# Patient Record
Sex: Female | Born: 1935 | Hispanic: No | Smoking: Former smoker
Health system: Southern US, Community
[De-identification: ages and names within clinical notes are randomized; demographics above are authoritative.]

## PROBLEM LIST (undated history)

## (undated) DIAGNOSIS — E7800 Pure hypercholesterolemia, unspecified: Secondary | ICD-10-CM

## (undated) DIAGNOSIS — I10 Essential (primary) hypertension: Secondary | ICD-10-CM

## (undated) HISTORY — PX: APPENDECTOMY: SHX54

---

## 2016-01-08 ENCOUNTER — Encounter (HOSPITAL_BASED_OUTPATIENT_CLINIC_OR_DEPARTMENT_OTHER): Payer: Self-pay | Admitting: *Deleted

## 2016-01-08 ENCOUNTER — Observation Stay (HOSPITAL_BASED_OUTPATIENT_CLINIC_OR_DEPARTMENT_OTHER)
Admission: EM | Admit: 2016-01-08 | Discharge: 2016-01-09 | Disposition: A | Discharge status: Home / Self Care | Payer: Self-pay | Attending: Family Medicine | Admitting: Family Medicine

## 2016-01-08 ENCOUNTER — Emergency Department (HOSPITAL_BASED_OUTPATIENT_CLINIC_OR_DEPARTMENT_OTHER): Payer: Self-pay

## 2016-01-08 DIAGNOSIS — R5383 Other fatigue: Secondary | ICD-10-CM

## 2016-01-08 DIAGNOSIS — D649 Anemia, unspecified: Secondary | ICD-10-CM | POA: Diagnosis present

## 2016-01-08 DIAGNOSIS — I10 Essential (primary) hypertension: Secondary | ICD-10-CM | POA: Diagnosis present

## 2016-01-08 DIAGNOSIS — R531 Weakness: Secondary | ICD-10-CM

## 2016-01-08 DIAGNOSIS — Z79899 Other long term (current) drug therapy: Secondary | ICD-10-CM | POA: Insufficient documentation

## 2016-01-08 DIAGNOSIS — Z87891 Personal history of nicotine dependence: Secondary | ICD-10-CM | POA: Insufficient documentation

## 2016-01-08 DIAGNOSIS — D509 Iron deficiency anemia, unspecified: Principal | ICD-10-CM | POA: Insufficient documentation

## 2016-01-08 DIAGNOSIS — E785 Hyperlipidemia, unspecified: Secondary | ICD-10-CM | POA: Diagnosis present

## 2016-01-08 HISTORY — DX: Essential (primary) hypertension: I10

## 2016-01-08 HISTORY — DX: Pure hypercholesterolemia, unspecified: E78.00

## 2016-01-08 LAB — URINALYSIS, ROUTINE W REFLEX MICROSCOPIC
Bilirubin Urine: NEGATIVE
GLUCOSE, UA: NEGATIVE mg/dL
HGB URINE DIPSTICK: NEGATIVE
Ketones, ur: NEGATIVE mg/dL
Nitrite: NEGATIVE
Protein, ur: NEGATIVE mg/dL
SPECIFIC GRAVITY, URINE: 1.008 (ref 1.005–1.030)
pH: 5 (ref 5.0–8.0)

## 2016-01-08 LAB — CBC WITH DIFFERENTIAL/PLATELET
Basophils Absolute: 0.1 10*3/uL (ref 0.0–0.1)
Basophils Relative: 1 %
Eosinophils Absolute: 0.1 10*3/uL (ref 0.0–0.7)
Eosinophils Relative: 1 %
HCT: 18.7 % — ABNORMAL LOW (ref 36.0–46.0)
Hemoglobin: 5.1 g/dL — CL (ref 12.0–15.0)
Lymphocytes Relative: 23 %
Lymphs Abs: 2 10*3/uL (ref 0.7–4.0)
MCH: 17.2 pg — ABNORMAL LOW (ref 26.0–34.0)
MCHC: 27.3 g/dL — ABNORMAL LOW (ref 30.0–36.0)
MCV: 63.2 fL — ABNORMAL LOW (ref 78.0–100.0)
Monocytes Absolute: 0.7 10*3/uL (ref 0.1–1.0)
Monocytes Relative: 8 %
Neutro Abs: 5.6 10*3/uL (ref 1.7–7.7)
Neutrophils Relative %: 67 %
Platelets: 586 10*3/uL — ABNORMAL HIGH (ref 150–400)
RBC: 2.96 MIL/uL — ABNORMAL LOW (ref 3.87–5.11)
RDW: 20.9 % — ABNORMAL HIGH (ref 11.5–15.5)
WBC: 8.5 10*3/uL (ref 4.0–10.5)

## 2016-01-08 LAB — RETICULOCYTES
RBC.: 2.64 MIL/uL — ABNORMAL LOW (ref 3.87–5.11)
RETIC CT PCT: 2.8 % (ref 0.4–3.1)
Retic Count, Absolute: 73.9 10*3/uL (ref 19.0–186.0)

## 2016-01-08 LAB — BASIC METABOLIC PANEL
Anion gap: 8 (ref 5–15)
BUN: 17 mg/dL (ref 6–20)
CO2: 25 mmol/L (ref 22–32)
Calcium: 8.9 mg/dL (ref 8.9–10.3)
Chloride: 101 mmol/L (ref 101–111)
Creatinine, Ser: 1.16 mg/dL — ABNORMAL HIGH (ref 0.44–1.00)
GFR calc Af Amer: 50 mL/min — ABNORMAL LOW (ref >60)
GFR calc non Af Amer: 43 mL/min — ABNORMAL LOW (ref >60)
Glucose, Bld: 134 mg/dL — ABNORMAL HIGH (ref 65–99)
Potassium: 3.9 mmol/L (ref 3.5–5.1)
Sodium: 134 mmol/L — ABNORMAL LOW (ref 135–145)

## 2016-01-08 LAB — IRON AND TIBC
IRON: 12 ug/dL — AB (ref 28–170)
SATURATION RATIOS: 3 % — AB (ref 10.4–31.8)
TIBC: 477 ug/dL — AB (ref 250–450)
UIBC: 465 ug/dL

## 2016-01-08 LAB — URINALYSIS, MICROSCOPIC (REFLEX): RBC / HPF: NONE SEEN RBC/hpf (ref 0–5)

## 2016-01-08 LAB — HEPATIC FUNCTION PANEL
ALBUMIN: 3.2 g/dL — AB (ref 3.5–5.0)
ALK PHOS: 70 U/L (ref 38–126)
ALT: 10 U/L — ABNORMAL LOW (ref 14–54)
AST: 19 U/L (ref 15–41)
BILIRUBIN TOTAL: 0.3 mg/dL (ref 0.3–1.2)
Bilirubin, Direct: <0.1 mg/dL — ABNORMAL LOW (ref 0.1–0.5)
Total Protein: 6.7 g/dL (ref 6.5–8.1)

## 2016-01-08 LAB — VITAMIN B12: VITAMIN B 12: 510 pg/mL (ref 180–914)

## 2016-01-08 LAB — TROPONIN I: Troponin I: <0.03 ng/mL (ref <0.03)

## 2016-01-08 LAB — OCCULT BLOOD X 1 CARD TO LAB, STOOL: Fecal Occult Bld: NEGATIVE

## 2016-01-08 LAB — FERRITIN: FERRITIN: 2 ng/mL — AB (ref 11–307)

## 2016-01-08 LAB — ABO/RH: ABO/RH(D): B POS

## 2016-01-08 LAB — FOLATE: Folate: 30.6 ng/mL (ref >5.9)

## 2016-01-08 LAB — PREPARE RBC (CROSSMATCH)

## 2016-01-08 MED ORDER — ONDANSETRON HCL 4 MG PO TABS: 4.0000 mg | ORAL_TABLET | Freq: Four times a day (QID) | ORAL | Status: DC | PRN

## 2016-01-08 MED ORDER — SIMVASTATIN 20 MG PO TABS
20.0000 mg | ORAL_TABLET | Freq: Every day | ORAL | Status: DC
  Administered 2016-01-08: 20 mg via ORAL
  Filled 2016-01-08: qty 1

## 2016-01-08 MED ORDER — ESCITALOPRAM OXALATE 20 MG PO TABS
20.0000 mg | ORAL_TABLET | Freq: Every day | ORAL | Status: DC
  Administered 2016-01-09: 20 mg via ORAL
  Filled 2016-01-08: qty 1

## 2016-01-08 MED ORDER — SODIUM CHLORIDE 0.9 % IV BOLUS (SEPSIS)
500.0000 mL | Freq: Once | INTRAVENOUS | Status: AC
  Administered 2016-01-08: 500 mL via INTRAVENOUS

## 2016-01-08 MED ORDER — ONDANSETRON HCL 4 MG/2ML IJ SOLN: 4.0000 mg | Freq: Four times a day (QID) | INTRAMUSCULAR | Status: DC | PRN

## 2016-01-08 MED ORDER — ACETAMINOPHEN 650 MG RE SUPP: 650.0000 mg | Freq: Four times a day (QID) | RECTAL | Status: DC | PRN

## 2016-01-08 MED ORDER — METOPROLOL TARTRATE 25 MG PO TABS
25.0000 mg | ORAL_TABLET | Freq: Three times a day (TID) | ORAL | Status: DC
  Administered 2016-01-09 (×2): 25 mg via ORAL
  Filled 2016-01-08 (×2): qty 1

## 2016-01-08 MED ORDER — ACETAMINOPHEN 325 MG PO TABS: 650.0000 mg | ORAL_TABLET | Freq: Four times a day (QID) | ORAL | Status: DC | PRN

## 2016-01-08 MED ORDER — SODIUM CHLORIDE 0.9 % IV SOLN
10.0000 mL/h | Freq: Once | INTRAVENOUS | Status: AC
  Administered 2016-01-08: 10 mL/h via INTRAVENOUS

## 2016-01-08 NOTE — ED Provider Notes (Signed)
[facility] Provider Note   CSN: [id number] Arrival date & time: 01/08/16  1224     History   Chief Complaint Chief Complaint  Patient presents with  . Shortness of Breath    HPI Wendy Jenkins is a 82 y.o. female.  The history is provided by the patient and medical records.  Shortness of Breath    82 year old female with history of hyperlipidemia hypertension, reported history of anemia, presenting to the ED for weakness, fatigue, shortness of breath. Patient only speaks [place], daughters at the bedside translating. Daughter reports patient has been experiencing these symptoms for about 3 weeks now, worse over the past 3 days. Patient was seen by her primary care doctor last week at her daughter's request. She had blood work done which came back revealing a hemoglobin of 5.4. Patient was told to go to the ED 2 days ago, however refused to go. Daughter reports that she has been seemingly getting worse, she did convince her to come to the ED today. She's not had any cough or fever. States she has had loose stools, light brown in color, for the past several weeks. No known sick contacts. Denies any abdominal pain.  Denies blood in the stool or melena.  Daughter reports she was told that she was anemic several years ago and briefly took Fe+ supplements but never took them regularly as she often refuses to take medications.  Daughter has been trying to give her multivitamins at home.  Denies chest pain.  No cardiac hx.  Was a smoker for 30+ years.  Past Medical History:  Diagnosis Date  . High cholesterol   . Hypertension     There are no active problems to display for this patient.   Past Surgical History:  Procedure Laterality Date  . APPENDECTOMY      OB History    No data available       Home Medications    Prior to Admission medications   Medication Sig Start Date End Date Taking? Authorizing Provider  Aspirin (ASPIR-81 PO) Take by mouth.   Yes  Historical Provider, MD  Escitalopram Oxalate (LEXAPRO PO) Take by mouth.   Yes Historical Provider, MD  MELATONIN PO Take by mouth.   Yes Historical Provider, MD  metoprolol tartrate (LOPRESSOR) 25 MG tablet Take 25 mg by mouth 2 (two) times daily.   Yes Historical Provider, MD  Multiple Vitamin (MULTI-VITAMIN DAILY PO) Take by mouth.   Yes Historical Provider, MD  SIMVASTATIN PO Take by mouth.   Yes Historical Provider, MD    Family History No family history on file.  Social History Social History  Substance Use Topics  . Smoking status: Former Games developermoker  . Smokeless tobacco: Never Used  . Alcohol use No     Allergies   Patient has no known allergies.   Review of Systems Review of Systems  Respiratory: Positive for shortness of breath.   Gastrointestinal: Positive for diarrhea.  Neurological: Positive for weakness.  All other systems reviewed and are negative.    Physical Exam Updated Vital Signs BP (!) 103/48 (BP Location: Left Arm)   Pulse 85   Temp 98 F (36.7 C) (Oral)   Resp (!) 28   Ht 5' (1.524 m)   Wt 69.9 kg   SpO2 97%   BMI 30.08 kg/m   Physical Exam  Constitutional: She is oriented to person, place, and time. She appears well-developed and well-nourished.  HENT:  Head: Normocephalic and atraumatic.  Mouth/Throat:  Oropharynx is clear and moist.  Eyes: Conjunctivae and EOM are normal. Pupils are equal, round, and reactive to light.  Conjunctiva pale  Neck: Normal range of motion.  Cardiovascular: Normal rate, regular rhythm and normal heart sounds.   Pulmonary/Chest: Effort normal and breath sounds normal. No respiratory distress. She has no wheezes.  No wheezes or rhonchi, no distress, able to speak in full sentences  Abdominal: Soft. Bowel sounds are normal. There is no tenderness. There is no rigidity and no guarding.  Musculoskeletal: Normal range of motion.  Neurological: She is alert and oriented to person, place, and time.  Skin: Skin is warm  and dry.  Psychiatric: She has a normal mood and affect.  Nursing note and vitals reviewed.    ED Treatments / Results  Labs (all labs ordered are listed, but only abnormal results are displayed) Labs Reviewed  CBC WITH DIFFERENTIAL/PLATELET - Abnormal; Notable for the following:       Result Value   RBC 2.96 (*)    Hemoglobin 5.1 (*)    HCT 18.7 (*)    MCV 63.2 (*)    MCH 17.2 (*)    MCHC 27.3 (*)    RDW 20.9 (*)    Platelets 586 (*)    All other components within normal limits  BASIC METABOLIC PANEL - Abnormal; Notable for the following:    Sodium 134 (*)    Glucose, Bld 134 (*)    Creatinine, Ser 1.16 (*)    GFR calc non Af Amer 43 (*)    GFR calc Af Amer 50 (*)    All other components within normal limits  HEPATIC FUNCTION PANEL - Abnormal; Notable for the following:    Albumin 3.2 (*)    ALT 10 (*)    Bilirubin, Direct <0.1 (*)    All other components within normal limits  URINALYSIS, ROUTINE W REFLEX MICROSCOPIC - Abnormal; Notable for the following:    Leukocytes, UA TRACE (*)    All other components within normal limits  URINALYSIS, MICROSCOPIC (REFLEX) - Abnormal; Notable for the following:    Bacteria, UA FEW (*)    Squamous Epithelial / LPF 0-5 (*)    All other components within normal limits  TROPONIN I  OCCULT BLOOD X 1 CARD TO LAB, STOOL  VITAMIN B12  FOLATE  IRON AND TIBC  FERRITIN  RETICULOCYTES    EKG  EKG Interpretation None       Radiology Dg Chest 2 View  Result Date: 01/08/2016 CLINICAL DATA:  Dyspnea and wheezing for 3 weeks.  Nonsmoker. EXAM: CHEST  2 VIEW COMPARISON:  None. FINDINGS: Mild enlargement of the cardiopericardial silhouette. Normal mediastinal contour. No pneumothorax. No pleural effusion. No overt pulmonary edema. No acute consolidative airspace disease. IMPRESSION: Mild enlargement of the cardiopericardial silhouette. Pericardial effusion cannot be excluded. No overt pulmonary edema. No acute pulmonary disease.  Electronically Signed   By: Delbert PhenixJason A Poff M.D.   On: 01/08/2016 13:27    Procedures Procedures (including critical care time)  Medications Ordered in ED Medications - No data to display   Initial Impression / Assessment and Plan / ED Course  I have reviewed the triage vital signs and the nursing notes.  Pertinent labs & imaging results that were available during my care of the patient were reviewed by me and considered in my medical decision making (see chart for details).  Clinical Course    82 year old female here with 3 weeks of fatigue, generalized weakness, and shortness of  breath and was told of hemoglobin of 5.1, however refused hospital transport at that time. Here today with worsening symptoms.  Patient is afebrile, non-toxic in appearance.  NAD.  VSS on RA.  No respiratory distress noted.  Work-up today with hgb of 5.1, appears microcytic.  Hemoccult negative.  Anemia panel sent.  CXR clear, trop negative.  VS remain stable on RA.  Patient will require admission for transfusion.  Discussed with patient and family members, they acknowledged understanding and need for hospitalization.  Will arrange transport to [facility] for admission.  hospitalist paged.  Discussed with hospitalist, Dr. Konrad Dolores-- will admit to tele/obs.  Have placed orders for transfusion of 2 units upon arrival there.  Temp admit orders placed.  Patient remains stable for transport.  Final Clinical Impressions(s) / ED Diagnoses   Final diagnoses:  Symptomatic anemia  Fatigue, unspecified type  Generalized weakness    New Prescriptions New Prescriptions   No medications on file     Garlon Hatchet, Cordelia Poche 01/08/16 1534    Raeford Razor, MD 01/09/16 1204

## 2016-01-08 NOTE — ED Triage Notes (Signed)
SOB x 3 weeks. She was seen by her MD last week and has test. She was called yesterday and told her HGB was 5.4 she was told to go to the ED but pt refused to go. She agreed to come to the ED today.

## 2016-01-08 NOTE — H&P (Signed)
History and Physical    Wendy Jenkins KGM:[id number] DOB: 24-Nov-1932 DOA: 01/08/2016  PCP: No PCP Per Patient  Patient coming from: Home.  [place] Nurse, learning disability used. History also obtained from patient's daughter.  Chief Complaint: Low hemoglobin.  HPI: Wendy Jenkins is a 82 y.o. female with history of hypertension, hyperlipidemia and possible CHF who has recently moved from [place] last year and lives with her daughter has been experiencing exertional shortness of breath over the last 3 weeks. Denies any chest pain. Patient had followed up with her PCP yesterday and had blood works done which showed hemoglobin of 5 and was referred to the ER. Patient has not noticed any black stools. Patient does take aspirin. Denies taking any NSAIDs. Stool for occult blood was negative. Due to symptomatic anemia patient is being admitted for blood transfusion.   ED Course: Hemoglobin was found to be around 5. Ferritin of 5. Chest x-ray shows cardiomegaly. EKG shows normal sinus rhythm. Stool for occult blood was negative.  Review of Systems: As per HPI, rest all negative.   Past Medical History:  Diagnosis Date  . High cholesterol   . Hypertension     Past Surgical History:  Procedure Laterality Date  . APPENDECTOMY       reports that she has quit smoking. She has never used smokeless tobacco. She reports that she does not drink alcohol. Her drug history is not on file.  No Known Allergies  Family History  Problem Relation Age of Onset  . Leukemia Mother   . Colon cancer Sister     Prior to Admission medications   Medication Sig Start Date End Date Taking? Authorizing Provider  Aspirin (ASPIR-81 PO) Take 1 tablet by mouth daily.    Yes Historical Provider, MD  escitalopram (LEXAPRO) 20 MG tablet Take 20 mg by mouth daily.   Yes Historical Provider, MD  furosemide (LASIX) 40 MG tablet Take 40 mg by mouth.   Yes Historical Provider, MD  MELATONIN PO Take 1 tablet by mouth at bedtime.  10mg    Yes Historical Provider, MD  metoprolol tartrate (LOPRESSOR) 25 MG tablet Take 25 mg by mouth 3 (three) times daily. Family members did state she is taking three times a day   Yes Historical Provider, MD  Multiple Vitamin (MULTI-VITAMIN DAILY PO) Take 1 tablet by mouth daily.    Yes Historical Provider, MD  simvastatin (ZOCOR) 20 MG tablet Take 20 mg by mouth at bedtime.   Yes Historical Provider, MD    Physical Exam: Vitals:   01/08/16 1335 01/08/16 1500 01/08/16 1600 01/08/16 1700  BP: (!) 94/45 106/88 112/90 115/56  Pulse: 70  (!) 54 77  Resp: 19 20 19  (!) 30  Temp:      TempSrc:      SpO2: 99%  98% 100%  Weight:      Height:          Constitutional: Moderately built and nourished. Vitals:   01/08/16 1335 01/08/16 1500 01/08/16 1600 01/08/16 1700  BP: (!) 94/45 106/88 112/90 115/56  Pulse: 70  (!) 54 77  Resp: 19 20 19  (!) 30  Temp:      TempSrc:      SpO2: 99%  98% 100%  Weight:      Height:       Eyes: Anicteric mild pallor. ENMT: No discharge from the ears eyes nose and mouth. Neck: No mass felt. No neck rigidity. Respiratory: No rhonchi or crepitations. Cardiovascular: S1 and S2 heard no murmurs  appreciated. Abdomen: Soft nontender bowel sounds present. No guarding or rigidity. Musculoskeletal: No edema. Skin: No rash. Neurologic: Alert awake oriented to time place and person. Moves all extremities. Psychiatric: Appears normal. Normal affect.   Labs on Admission: I have personally reviewed following labs and imaging studies  CBC:  Recent Labs Lab 01/08/16 1250  WBC 8.5  NEUTROABS 5.6  HGB 5.1*  HCT 18.7*  MCV 63.2*  PLT 586*   Basic Metabolic Panel:  Recent Labs Lab 01/08/16 1250  NA 134*  K 3.9  CL 101  CO2 25  GLUCOSE 134*  BUN 17  CREATININE 1.16*  CALCIUM 8.9   GFR: Estimated Creatinine Clearance: 33.2 mL/min (by C-G formula based on SCr of 1.16 mg/dL (H)). Liver Function Tests:  Recent Labs Lab 01/08/16 1320  AST 19    ALT 10*  ALKPHOS 70  BILITOT 0.3  PROT 6.7  ALBUMIN 3.2*   No results for input(s): LIPASE, AMYLASE in the last 168 hours. No results for input(s): AMMONIA in the last 168 hours. Coagulation Profile: No results for input(s): INR, PROTIME in the last 168 hours. Cardiac Enzymes:  Recent Labs Lab 01/08/16 1320  TROPONINI <0.03   BNP (last 3 results) No results for input(s): PROBNP in the last 8760 hours. HbA1C: No results for input(s): HGBA1C in the last 72 hours. CBG: No results for input(s): GLUCAP in the last 168 hours. Lipid Profile: No results for input(s): CHOL, HDL, LDLCALC, TRIG, CHOLHDL, LDLDIRECT in the last 72 hours. Thyroid Function Tests: No results for input(s): TSH, T4TOTAL, FREET4, T3FREE, THYROIDAB in the last 72 hours. Anemia Panel:  Recent Labs  01/08/16 1320  VITAMINB12 510  FOLATE 30.6  FERRITIN 2*  TIBC 477*  IRON 12*  RETICCTPCT 2.8   Urine analysis:    Component Value Date/Time   COLORURINE YELLOW 01/08/2016 1320   APPEARANCEUR CLEAR 01/08/2016 1320   LABSPEC 1.008 01/08/2016 1320   PHURINE 5.0 01/08/2016 1320   GLUCOSEU NEGATIVE 01/08/2016 1320   HGBUR NEGATIVE 01/08/2016 1320   BILIRUBINUR NEGATIVE 01/08/2016 1320   KETONESUR NEGATIVE 01/08/2016 1320   PROTEINUR NEGATIVE 01/08/2016 1320   NITRITE NEGATIVE 01/08/2016 1320   LEUKOCYTESUR TRACE (A) 01/08/2016 1320   Sepsis Labs: @LABRCNTIP (procalcitonin:4,lacticidven:4) )No results found for this or any previous visit (from the past 240 hour(s)).   Radiological Exams on Admission: Dg Chest 2 View  Result Date: 01/08/2016 CLINICAL DATA:  Dyspnea and wheezing for 3 weeks.  Nonsmoker. EXAM: CHEST  2 VIEW COMPARISON:  None. FINDINGS: Mild enlargement of the cardiopericardial silhouette. Normal mediastinal contour. No pneumothorax. No pleural effusion. No overt pulmonary edema. No acute consolidative airspace disease. IMPRESSION: Mild enlargement of the cardiopericardial silhouette.  Pericardial effusion cannot be excluded. No overt pulmonary edema. No acute pulmonary disease. Electronically Signed   By: Delbert PhenixJason A Poff M.D.   On: 01/08/2016 13:27    EKG: Independently reviewed. Normal sinus rhythm.  Assessment/Plan Active Problems:   Symptomatic anemia   HLD (hyperlipidemia)   Essential hypertension   Anemia    1. Symptomatic anemia and iron deficiency anemia - 2 units of packed red blood cell transfusion has been ordered. Patient will need to be discharged on iron tablets. I have discussed about colonoscopy but patient's daughter at this time is not keen on pursuing colonoscopy. 2. Hypertension on metoprolol. 3. Possible CHF with chest x-ray showing cardiomegaly - patient is on Lasix. Please order after transfusion. 4. Hyperlipidemia on statins.   DVT prophylaxis: SCDs. Code Status: DO NOT RESUSCITATE.  Family Communication: Patient's daughter.  Disposition Plan: Home.  Consults called: None.  Admission status: Observation.    Eduard ClosKAKRAKANDY,Carlina Derks N. MD [facility] Pager [phone].  If 7PM-7AM, please contact night-coverage [email or web address] Password [email or web address]  01/08/2016, 9:03 PM

## 2016-01-08 NOTE — ED Notes (Signed)
Family at bedside. 

## 2016-01-09 DIAGNOSIS — E785 Hyperlipidemia, unspecified: Secondary | ICD-10-CM

## 2016-01-09 DIAGNOSIS — I10 Essential (primary) hypertension: Secondary | ICD-10-CM

## 2016-01-09 DIAGNOSIS — D649 Anemia, unspecified: Secondary | ICD-10-CM

## 2016-01-09 LAB — BASIC METABOLIC PANEL
Anion gap: 9 (ref 5–15)
BUN: 13 mg/dL (ref 6–20)
CHLORIDE: 104 mmol/L (ref 101–111)
CO2: 25 mmol/L (ref 22–32)
CREATININE: 1.02 mg/dL — AB (ref 0.44–1.00)
Calcium: 9 mg/dL (ref 8.9–10.3)
GFR calc Af Amer: 58 mL/min — ABNORMAL LOW (ref >60)
GFR calc non Af Amer: 50 mL/min — ABNORMAL LOW (ref >60)
Glucose, Bld: 144 mg/dL — ABNORMAL HIGH (ref 65–99)
Potassium: 3.6 mmol/L (ref 3.5–5.1)
Sodium: 138 mmol/L (ref 135–145)

## 2016-01-09 LAB — CBC
HEMATOCRIT: 27 % — AB (ref 36.0–46.0)
Hemoglobin: 8.2 g/dL — ABNORMAL LOW (ref 12.0–15.0)
MCH: 20.6 pg — AB (ref 26.0–34.0)
MCHC: 30.4 g/dL (ref 30.0–36.0)
MCV: 67.7 fL — AB (ref 78.0–100.0)
PLATELETS: 459 10*3/uL — AB (ref 150–400)
RBC: 3.99 MIL/uL (ref 3.87–5.11)
RDW: 22.9 % — AB (ref 11.5–15.5)
WBC: 5.5 10*3/uL (ref 4.0–10.5)

## 2016-01-09 MED ORDER — SODIUM CHLORIDE 0.9 % IV SOLN
510.0000 mg | Freq: Once | INTRAVENOUS | Status: DC
  Filled 2016-01-09: qty 17

## 2016-01-09 MED ORDER — SODIUM CHLORIDE 0.9 % IV SOLN
510.0000 mg | Freq: Once | INTRAVENOUS | Status: AC
  Administered 2016-01-09: 510 mg via INTRAVENOUS
  Filled 2016-01-09 (×3): qty 17

## 2016-01-09 MED ORDER — FERROUS SULFATE 325 (65 FE) MG PO TABS: 325.0000 mg | ORAL_TABLET | Freq: Two times a day (BID) | ORAL | 0 refills | Status: DC

## 2016-01-09 NOTE — Progress Notes (Signed)
Called [employer]pre[employer] several times today for [place] translation with regards to her medications,side effects  and the scheduled dose of intravenous fereheme.Explained to patient that the plan for her is to be discharged today and she needs somebody from her family members to get her from here this afternoon.Patient given a chance to ask questions and were answered satisfactorily by this Clinical research associatewriter

## 2016-01-09 NOTE — Progress Notes (Signed)
MEDICATION RELATED CONSULT NOTE - INITIAL   Pharmacy Consult for IV Iron Indication: Symptomatic Iron Deficiency Anemia  No Known Allergies  Patient Measurements: Height: 5' (152.4 cm) Weight: 146 lb 9.7 oz (66.5 kg) IBW/kg (Calculated) : 45.5  Vital Signs: Temp: 98.2 F (36.8 C) (12/29 0821) Temp Source: Oral (12/29 0821) BP: 121/59 (12/29 0821) Pulse Rate: 76 (12/29 0400) Intake/Output from previous day: 12/28 0701 - 12/29 0700 In: 1030 [P.O.:360; Blood:670] Out: 0  Intake/Output from this shift: No intake/output data recorded.  Labs:  Recent Labs  01/08/16 1250 01/08/16 1320  WBC 8.5  --   HGB 5.1*  --   HCT 18.7*  --   PLT 586*  --   CREATININE 1.16*  --   ALBUMIN  --  3.2*  PROT  --  6.7  AST  --  19  ALT  --  10*  ALKPHOS  --  70  BILITOT  --  0.3  BILIDIR  --  <0.1*  IBILI  --  NOT CALCULATED   Estimated Creatinine Clearance: 32.4 mL/min (by C-G formula based on SCr of 1.16 mg/dL (H)).   Microbiology: No results found for this or any previous visit (from the past 720 hour(s)).  Medical History: Past Medical History:  Diagnosis Date  . High cholesterol   . Hypertension     Assessment: Wendy Jenkins is an 82 yo F who presented from PCP after blood work showing Hemoglobin of 5. In the ED, fecal occult blood test was negative and patient denied dark stools. Hemoglobin checked here is consistent with outpatient report. CBC consistent with microcytic anemia. Iron studies revealed ferritin of 2 and a TSAT of 3%. Pharmacy consulted to dose IV iron.  Goal of Therapy:  Hemoglobin 12- 15 g/dL Ferritin 161500 - 096800 ng/mL TSAT 30 - 50%  Plan:  Feraheme 510mg  x1; may repeat in 3 days if desired Monitor hemoglobin and iron studies Pharmacy will monitor peripherally  Alfredo BachJoseph Arminger, Cleotis NipperBS, PharmD Clinical Pharmacy Resident [phone] (Pager) 01/09/2016 9:11 AM   I discussed / reviewed the pharmacy note by Dr. Lavella LemonsArminger and I agree with the resident's findings and  plans as documented.  Toys 'R' UsKimberly Aleric Froelich, Pharm.D., BCPS Clinical Pharmacist Pager [phone] 01/09/2016 11:35 AM

## 2016-01-09 NOTE — Discharge Summary (Signed)
PATIENT DETAILS Name: Wendy Jenkins Age: 82 y.o. Sex: female Date of Birth: 11-06-1932 MRN: [id number]. Admitting Physician: Ozella Rocks, MD [id number], Katharine Look, MD  Admit Date: 01/08/2016 Discharge date: 01/09/2016  Recommendations for Outpatient Follow-up:  1. Follow up with PCP in 1-2 weeks 2. Started on iron supplementation 3. Please obtain BMP/CBC in one week 4. If patient changes her mind about endoscopic procedures-please refer to GI for colonoscopy.  Admitted From:  Home   Disposition: Home    Home Health: No  Equipment/Devices: None  Discharge Condition: Stable  CODE STATUS: FULL CODE  Diet recommendation:  Heart Healthy   Brief Summary: See H&P, Labs, Consult and Test reports for all details in brief, Wendy Jenkins is a 82 y.o. female with history of hypertension, hyperlipidemia who has recently moved from [place] last year and lives with her daughter was experiencing exertional shortness of breath over the last 3 weeks-him to have a hemoglobin of 5.1. She was subsequently admitted for further evaluation and treatment.  Brief Hospital Course: Microcytic anemia: No overt history of black stools/GI bleed. FOBT negative. She was admitted and transfused 2 units. Iron panel consistent with iron deficiency-and started on iron supplementation. Long discussion with the patient with translator over the phone, and subsequently with her daughter who is a nurse over the phone-patient refuses to consider any endoscopic procedures at this time. I have explained that she could have underlying malignancy-colon cancer. I have asked the patient and the daughter to follow up with their PCP for CBC checks, and if patient changes her mind regarding endoscopic procedures-to follow up with the PCP and get a referral to GI.  Note-patient is repeatedly requesting to be discharged and does not want to be hospitalized any further.  Rest of her medical problems were stable during  this short hospital stay.  Procedures/Studies: None  Discharge Diagnoses:  Active Problems:   Symptomatic anemia   HLD (hyperlipidemia)   Essential hypertension   Anemia   Discharge Instructions:  Activity:  As tolerated with Full fall precautions use walker/cane & assistance as needed  Discharge Instructions    Diet - low sodium heart healthy    Complete by:  As directed    Discharge instructions    Complete by:  As directed    Follow with Primary MD in 1 week  Please get a complete blood count and chemistry panel checked by your Primary MD at your next visit, and again as instructed by your Primary MD.  Get Medicines reviewed and adjusted: Please take all your medications with you for your next visit with your Primary MD  Laboratory/radiological data: Please request your Primary MD to go over all hospital tests and procedure/radiological results at the follow up, please ask your Primary MD to get all Hospital records sent to his/her office.  In some cases, they will be blood work, cultures and biopsy results pending at the time of your discharge. Please request that your primary care M.D. follows up on these results.  Also Note the following: If you experience worsening of your admission symptoms, develop shortness of breath, life threatening emergency, suicidal or homicidal thoughts you must seek medical attention immediately by calling 911 or calling your MD immediately  if symptoms less severe.  You must read complete instructions/literature along with all the possible adverse reactions/side effects for all the Medicines you take and that have been prescribed to you. Take any new Medicines after you have completely understood and accpet all the possible  adverse reactions/side effects.   Do not drive when taking Pain medications or sleeping medications (Benzodaizepines)  Do not take more than prescribed Pain, Sleep and Anxiety Medications. It is not advisable to combine  anxiety,sleep and pain medications without talking with your primary care practitioner  Special Instructions: If you have smoked or chewed Tobacco  in the last 2 yrs please stop smoking, stop any regular Alcohol  and or any Recreational drug use.  Wear Seat belts while driving.  Please note: You were cared for by a hospitalist during your hospital stay. Once you are discharged, your primary care physician will handle any further medical issues. Please note that NO REFILLS for any discharge medications will be authorized once you are discharged, as it is imperative that you return to your primary care physician (or establish a relationship with a primary care physician if you do not have one) for your post hospital discharge needs so that they can reassess your need for medications and monitor your lab values.   Increase activity slowly    Complete by:  As directed      Allergies as of 01/09/2016   No Known Allergies     Medication List    STOP taking these medications   ASPIR-81 PO     TAKE these medications   escitalopram 20 MG tablet Commonly known as:  LEXAPRO Take 20 mg by mouth daily.   ferrous sulfate 325 (65 FE) MG tablet Take 1 tablet (325 mg total) by mouth 2 (two) times daily with a meal.   furosemide 40 MG tablet Commonly known as:  LASIX Take 40 mg by mouth.   MELATONIN PO Take 1 tablet by mouth at bedtime. 10mg    metoprolol tartrate 25 MG tablet Commonly known as:  LOPRESSOR Take 25 mg by mouth 3 (three) times daily. Family members did state she is taking three times a day   MULTI-VITAMIN DAILY PO Take 1 tablet by mouth daily.   simvastatin 20 MG tablet Commonly known as:  ZOCOR Take 20 mg by mouth at bedtime.      Follow-up Information    Primary MD. Schedule an appointment as soon as possible for a visit in 1 week(s).          No Known Allergies  Consultations:   None  Other Procedures/Studies: Dg Chest 2 View  Result Date:  01/08/2016 CLINICAL DATA:  Dyspnea and wheezing for 3 weeks.  Nonsmoker. EXAM: CHEST  2 VIEW COMPARISON:  None. FINDINGS: Mild enlargement of the cardiopericardial silhouette. Normal mediastinal contour. No pneumothorax. No pleural effusion. No overt pulmonary edema. No acute consolidative airspace disease. IMPRESSION: Mild enlargement of the cardiopericardial silhouette. Pericardial effusion cannot be excluded. No overt pulmonary edema. No acute pulmonary disease. Electronically Signed   By: Delbert Phenix M.D.   On: 01/08/2016 13:27     TODAY-DAY OF DISCHARGE:  Subjective:   Neena Rhymes today has no headache,no chest abdominal pain,no new weakness tingling or numbness, feels much better wants to go home today.   Objective:   Blood pressure (!) 119/42, pulse (!) 54, temperature 98.7 F (37.1 C), temperature source Oral, resp. rate 16, height 5' (1.524 m), weight 66.5 kg (146 lb 9.7 oz), SpO2 99 %.  Intake/Output Summary (Last 24 hours) at 01/09/16 1204 Last data filed at 01/09/16 1127  Gross per 24 hour  Intake             1550 ml  Output  0 ml  Net             1550 ml   Filed Weights   01/08/16 1230 01/08/16 2130 01/09/16 0500  Weight: 69.9 kg (154 lb) 69.8 kg (153 lb 14.1 oz) 66.5 kg (146 lb 9.7 oz)    Exam: Awake Alert, Oriented *3, No new F.N deficits, Normal affect [place].AT,PERRAL Supple Neck,No JVD, No cervical lymphadenopathy appriciated.  Symmetrical Chest wall movement, Good air movement bilaterally, CTAB RRR,No Gallops,Rubs or new Murmurs, No Parasternal Heave +ve B.Sounds, Abd Soft, Non tender, No organomegaly appriciated, No rebound -guarding or rigidity. No Cyanosis, Clubbing or edema, No new Rash or bruise   PERTINENT RADIOLOGIC STUDIES: Dg Chest 2 View  Result Date: 01/08/2016 CLINICAL DATA:  Dyspnea and wheezing for 3 weeks.  Nonsmoker. EXAM: CHEST  2 VIEW COMPARISON:  None. FINDINGS: Mild enlargement of the cardiopericardial silhouette. Normal  mediastinal contour. No pneumothorax. No pleural effusion. No overt pulmonary edema. No acute consolidative airspace disease. IMPRESSION: Mild enlargement of the cardiopericardial silhouette. Pericardial effusion cannot be excluded. No overt pulmonary edema. No acute pulmonary disease. Electronically Signed   By: Delbert PhenixJason A Poff M.D.   On: 01/08/2016 13:27     PERTINENT LAB RESULTS: CBC:  Recent Labs  01/08/16 1250 01/09/16 0915  WBC 8.5 5.5  HGB 5.1* 8.2*  HCT 18.7* 27.0*  PLT 586* 459*   CMET CMP     Component Value Date/Time   NA 138 01/09/2016 0915   K 3.6 01/09/2016 0915   CL 104 01/09/2016 0915   CO2 25 01/09/2016 0915   GLUCOSE 144 (H) 01/09/2016 0915   BUN 13 01/09/2016 0915   CREATININE 1.02 (H) 01/09/2016 0915   CALCIUM 9.0 01/09/2016 0915   PROT 6.7 01/08/2016 1320   ALBUMIN 3.2 (L) 01/08/2016 1320   AST 19 01/08/2016 1320   ALT 10 (L) 01/08/2016 1320   ALKPHOS 70 01/08/2016 1320   BILITOT 0.3 01/08/2016 1320   GFRNONAA 50 (L) 01/09/2016 0915   GFRAA 58 (L) 01/09/2016 0915    GFR Estimated Creatinine Clearance: 36.8 mL/min (by C-G formula based on SCr of 1.02 mg/dL (H)). No results for input(s): LIPASE, AMYLASE in the last 72 hours.  Recent Labs  01/08/16 1320  TROPONINI <0.03   Invalid input(s): POCBNP No results for input(s): DDIMER in the last 72 hours. No results for input(s): HGBA1C in the last 72 hours. No results for input(s): CHOL, HDL, LDLCALC, TRIG, CHOLHDL, LDLDIRECT in the last 72 hours. No results for input(s): TSH, T4TOTAL, T3FREE, THYROIDAB in the last 72 hours.  Invalid input(s): FREET3  Recent Labs  01/08/16 1320  VITAMINB12 510  FOLATE 30.6  FERRITIN 2*  TIBC 477*  IRON 12*  RETICCTPCT 2.8   Coags: No results for input(s): INR in the last 72 hours.  Invalid input(s): PT Microbiology: No results found for this or any previous visit (from the past 240 hour(s)).  FURTHER DISCHARGE INSTRUCTIONS:  Get Medicines reviewed  and adjusted: Please take all your medications with you for your next visit with your Primary MD  Laboratory/radiological data: Please request your Primary MD to go over all hospital tests and procedure/radiological results at the follow up, please ask your Primary MD to get all Hospital records sent to his/her office.  In some cases, they will be blood work, cultures and biopsy results pending at the time of your discharge. Please request that your primary care M.D. goes through all the records of your hospital data and follows up  on these results.  Also Note the following: If you experience worsening of your admission symptoms, develop shortness of breath, life threatening emergency, suicidal or homicidal thoughts you must seek medical attention immediately by calling 911 or calling your MD immediately  if symptoms less severe.  You must read complete instructions/literature along with all the possible adverse reactions/side effects for all the Medicines you take and that have been prescribed to you. Take any new Medicines after you have completely understood and accpet all the possible adverse reactions/side effects.   Do not drive when taking Pain medications or sleeping medications (Benzodaizepines)  Do not take more than prescribed Pain, Sleep and Anxiety Medications. It is not advisable to combine anxiety,sleep and pain medications without talking with your primary care practitioner  Special Instructions: If you have smoked or chewed Tobacco  in the last 2 yrs please stop smoking, stop any regular Alcohol  and or any Recreational drug use.  Wear Seat belts while driving.  Please note: You were cared for by a hospitalist during your hospital stay. Once you are discharged, your primary care physician will handle any further medical issues. Please note that NO REFILLS for any discharge medications will be authorized once you are discharged, as it is imperative that you return to your  primary care physician (or establish a relationship with a primary care physician if you do not have one) for your post hospital discharge needs so that they can reassess your need for medications and monitor your lab values.  Total Time spent coordinating discharge including counseling, education and face to face time equals 35 minutes.  SignedJeoffrey Massed: Samiyyah Moffa 01/09/2016 12:04 PM

## 2016-01-09 NOTE — Progress Notes (Signed)
Wendy Jenkins to be D/C'd Home per MD order.  Discussed prescriptions and follow up appointments with the patient. Prescriptions given to patient, medication list explained in detail. Pt verbalized understanding.  Allergies as of 01/09/2016   No Known Allergies     Medication List    STOP taking these medications   ASPIR-81 PO     TAKE these medications   escitalopram 20 MG tablet Commonly known as:  LEXAPRO Take 20 mg by mouth daily.   ferrous sulfate 325 (65 FE) MG tablet Take 1 tablet (325 mg total) by mouth 2 (two) times daily with a meal.   furosemide 40 MG tablet Commonly known as:  LASIX Take 40 mg by mouth.   MELATONIN PO Take 1 tablet by mouth at bedtime. 10mg    metoprolol tartrate 25 MG tablet Commonly known as:  LOPRESSOR Take 25 mg by mouth 3 (three) times daily. Family members did state she is taking three times a day   MULTI-VITAMIN DAILY PO Take 1 tablet by mouth daily.   simvastatin 20 MG tablet Commonly known as:  ZOCOR Take 20 mg by mouth at bedtime.       Vitals:   01/09/16 0924 01/09/16 1648  BP: (!) 119/42 (!) 129/58  Pulse: (!) 54 83  Resp: 16 17  Temp: 98.7 F (37.1 C) 99.2 F (37.3 C)    Skin clean, dry and intact without evidence of skin break down, no evidence of skin tears noted. IV catheter discontinued intact. Site without signs and symptoms of complications. Dressing and pressure applied. Pt denies pain at this time. No complaints noted.  An After Visit Summary was printed and given to the patient. Patient escorted via WC, and D/C home via private auto.  Wendy ForehandLuke Lona Jenkins BSN, RN

## 2016-01-10 LAB — TYPE AND SCREEN
ABO/RH(D): B POS
Antibody Screen: NEGATIVE
UNIT DIVISION: 0
UNIT DIVISION: 0

## 2017-10-11 IMAGING — DX DG CHEST 2V
2 series · 2 of 2 positions shown · non-contrast
Comparison: None.

CLINICAL DATA: Dyspnea and wheezing for 3 weeks.  Nonsmoker.

EXAM:
CHEST  2 VIEW

[chest pa]
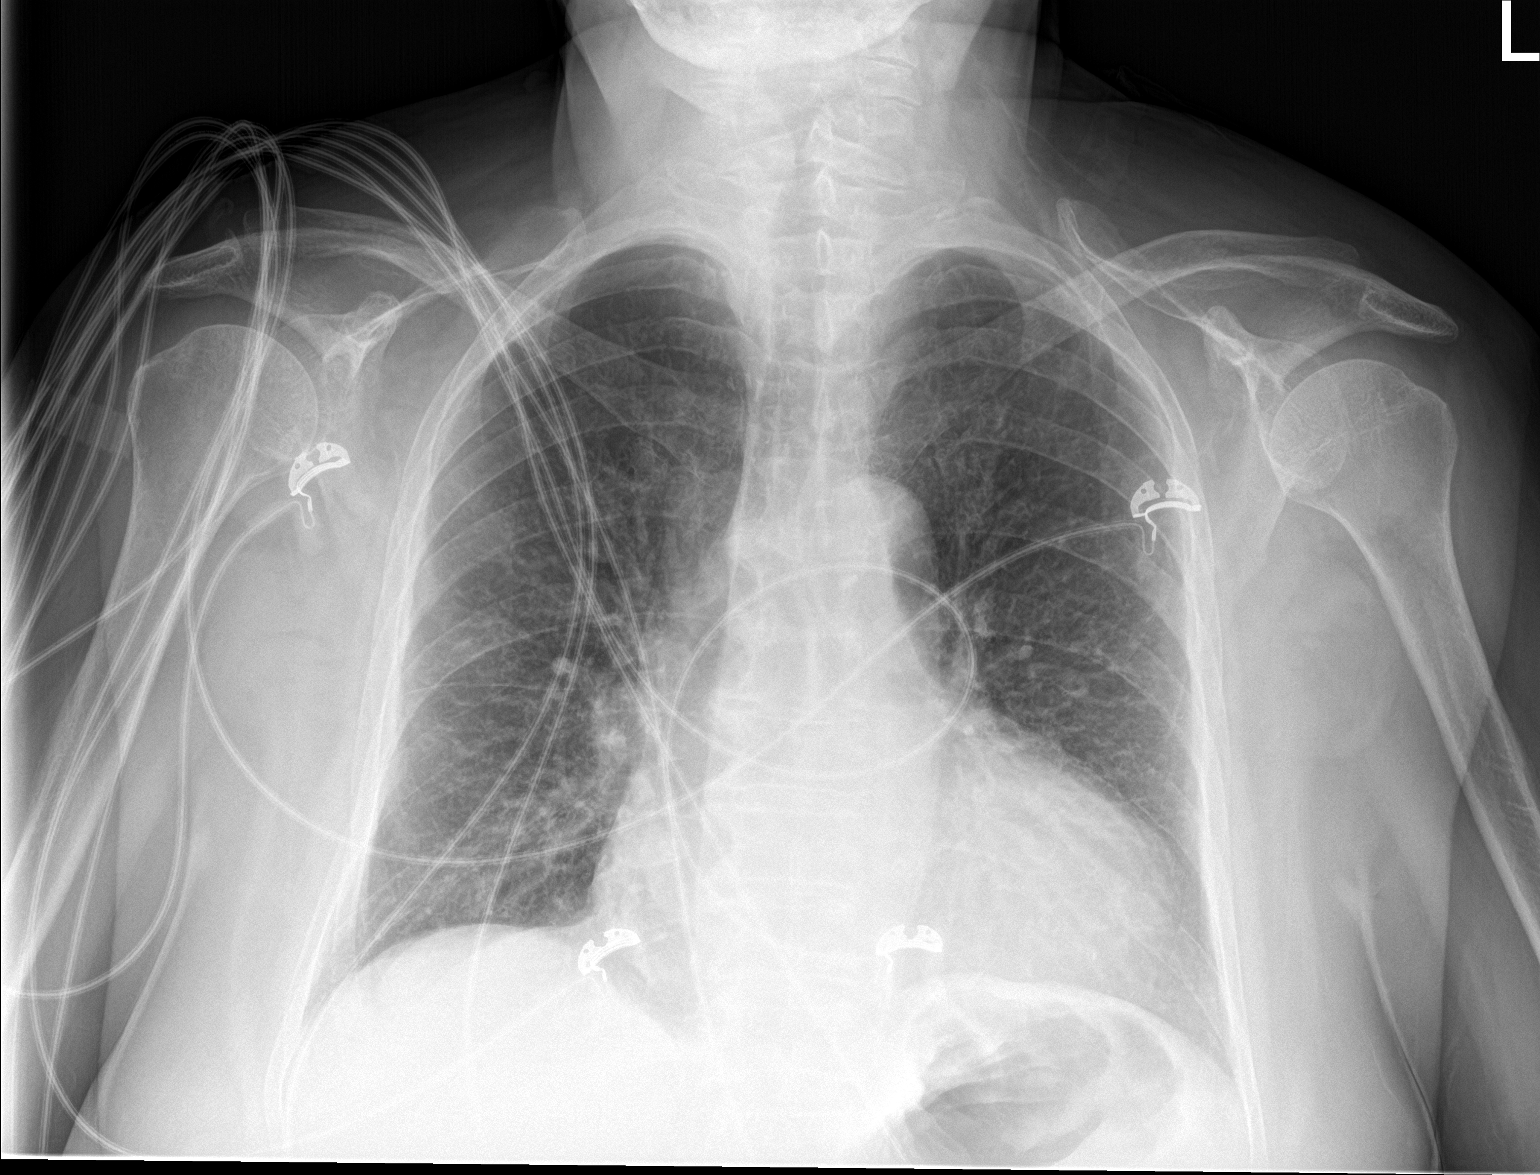

[chest lat]
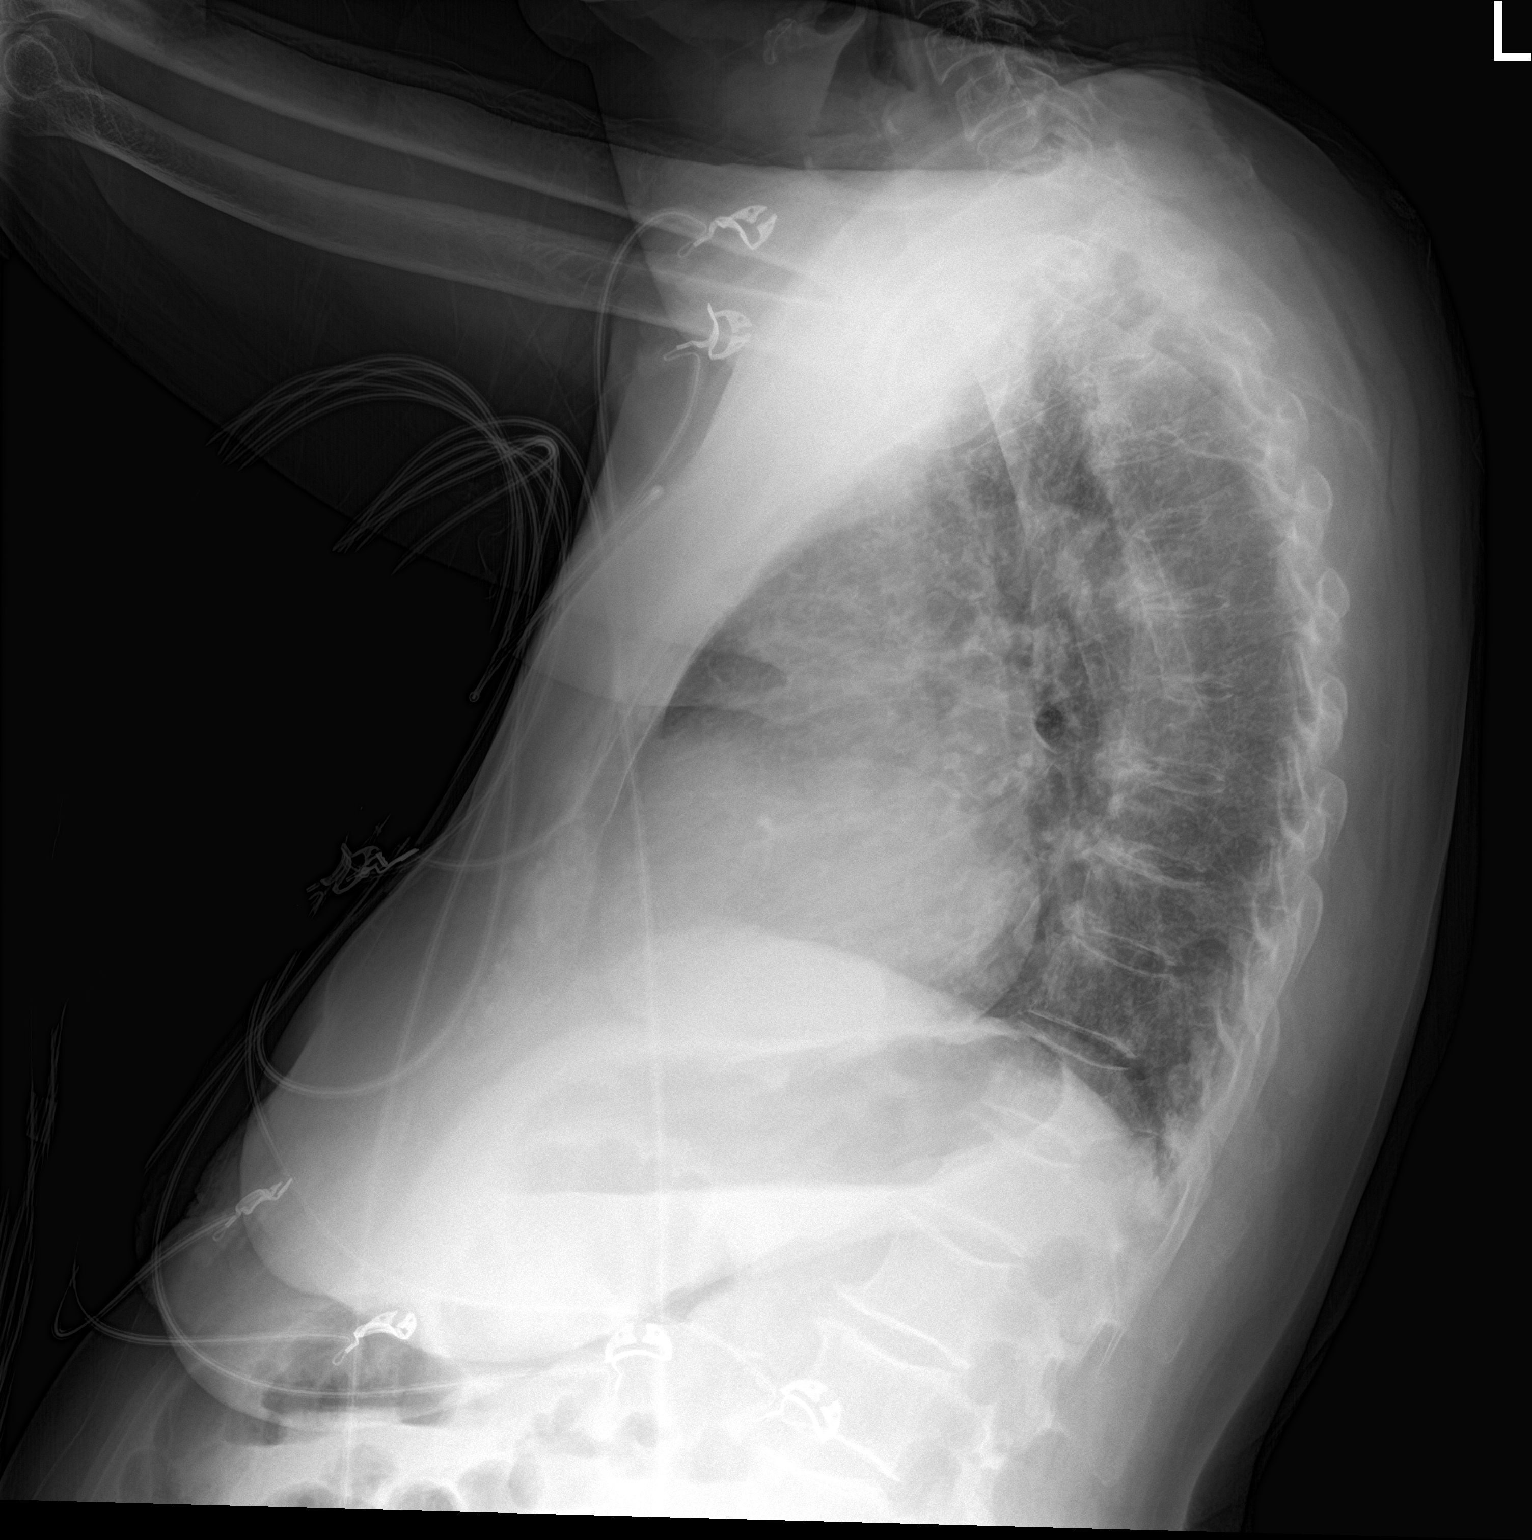

[2 of 2 positions shown; findings below may reference images not displayed]

FINDINGS: Mild enlargement of the cardiopericardial silhouette. Normal
mediastinal contour. No pneumothorax. No pleural effusion. No overt
pulmonary edema. No acute consolidative airspace disease.
IMPRESSION: Mild enlargement of the cardiopericardial silhouette. Pericardial
effusion cannot be excluded. No overt pulmonary edema. No acute
pulmonary disease.

## 2022-05-08 ENCOUNTER — Other Ambulatory Visit: Payer: Self-pay

## 2022-05-08 ENCOUNTER — Emergency Department (HOSPITAL_COMMUNITY): Payer: Medicaid Other

## 2022-05-08 ENCOUNTER — Encounter (HOSPITAL_COMMUNITY): Payer: Self-pay

## 2022-05-08 ENCOUNTER — Emergency Department (HOSPITAL_COMMUNITY)
Admission: EM | Admit: 2022-05-08 | Discharge: 2022-05-09 | Disposition: A | Discharge status: Other Acute Inpatient Hospital | Payer: Medicaid Other | Attending: Emergency Medicine | Admitting: Emergency Medicine

## 2022-05-08 DIAGNOSIS — W1830XA Fall on same level, unspecified, initial encounter: Secondary | ICD-10-CM | POA: Diagnosis not present

## 2022-05-08 DIAGNOSIS — K047 Periapical abscess without sinus: Secondary | ICD-10-CM

## 2022-05-08 DIAGNOSIS — I4891 Unspecified atrial fibrillation: Secondary | ICD-10-CM | POA: Diagnosis not present

## 2022-05-08 DIAGNOSIS — D649 Anemia, unspecified: Secondary | ICD-10-CM | POA: Diagnosis present

## 2022-05-08 DIAGNOSIS — D72829 Elevated white blood cell count, unspecified: Secondary | ICD-10-CM | POA: Insufficient documentation

## 2022-05-08 DIAGNOSIS — L03211 Cellulitis of face: Secondary | ICD-10-CM

## 2022-05-08 DIAGNOSIS — I480 Paroxysmal atrial fibrillation: Secondary | ICD-10-CM | POA: Diagnosis present

## 2022-05-08 DIAGNOSIS — M272 Inflammatory conditions of jaws: Secondary | ICD-10-CM | POA: Diagnosis present

## 2022-05-08 DIAGNOSIS — G9341 Metabolic encephalopathy: Secondary | ICD-10-CM

## 2022-05-08 DIAGNOSIS — R6520 Severe sepsis without septic shock: Secondary | ICD-10-CM

## 2022-05-08 DIAGNOSIS — N179 Acute kidney failure, unspecified: Secondary | ICD-10-CM | POA: Diagnosis present

## 2022-05-08 DIAGNOSIS — I10 Essential (primary) hypertension: Secondary | ICD-10-CM | POA: Diagnosis present

## 2022-05-08 DIAGNOSIS — Z85038 Personal history of other malignant neoplasm of large intestine: Secondary | ICD-10-CM | POA: Insufficient documentation

## 2022-05-08 DIAGNOSIS — E7800 Pure hypercholesterolemia, unspecified: Secondary | ICD-10-CM

## 2022-05-08 DIAGNOSIS — E785 Hyperlipidemia, unspecified: Secondary | ICD-10-CM | POA: Diagnosis present

## 2022-05-08 DIAGNOSIS — A419 Sepsis, unspecified organism: Secondary | ICD-10-CM | POA: Diagnosis present

## 2022-05-08 DIAGNOSIS — Z7901 Long term (current) use of anticoagulants: Secondary | ICD-10-CM | POA: Insufficient documentation

## 2022-05-08 DIAGNOSIS — R220 Localized swelling, mass and lump, head: Secondary | ICD-10-CM | POA: Diagnosis present

## 2022-05-08 LAB — COMPREHENSIVE METABOLIC PANEL
ALT: 17 U/L (ref 0–44)
AST: 20 U/L (ref 15–41)
Albumin: 3.1 g/dL — ABNORMAL LOW (ref 3.5–5.0)
Alkaline Phosphatase: 97 U/L (ref 38–126)
Anion gap: 16 — ABNORMAL HIGH (ref 5–15)
BUN: 29 mg/dL — ABNORMAL HIGH (ref 8–23)
CO2: 19 mmol/L — ABNORMAL LOW (ref 22–32)
Calcium: 10 mg/dL (ref 8.9–10.3)
Chloride: 100 mmol/L (ref 98–111)
Creatinine, Ser: 1.56 mg/dL — ABNORMAL HIGH (ref 0.44–1.00)
GFR, Estimated: 32 mL/min — ABNORMAL LOW (ref >60)
Glucose, Bld: 144 mg/dL — ABNORMAL HIGH (ref 70–99)
Potassium: 4.1 mmol/L (ref 3.5–5.1)
Sodium: 135 mmol/L (ref 135–145)
Total Bilirubin: 0.6 mg/dL (ref 0.3–1.2)
Total Protein: 7.2 g/dL (ref 6.5–8.1)

## 2022-05-08 LAB — URINALYSIS, ROUTINE W REFLEX MICROSCOPIC
Glucose, UA: NEGATIVE mg/dL
Ketones, ur: 15 mg/dL — AB
Leukocytes,Ua: NEGATIVE
Nitrite: NEGATIVE
Protein, ur: NEGATIVE mg/dL
Specific Gravity, Urine: 1.02 (ref 1.005–1.030)
pH: 5.5 (ref 5.0–8.0)

## 2022-05-08 LAB — I-STAT CHEM 8, ED
BUN: 31 mg/dL — ABNORMAL HIGH (ref 8–23)
Calcium, Ion: 1.28 mmol/L (ref 1.15–1.40)
Chloride: 104 mmol/L (ref 98–111)
Creatinine, Ser: 1.5 mg/dL — ABNORMAL HIGH (ref 0.44–1.00)
Glucose, Bld: 135 mg/dL — ABNORMAL HIGH (ref 70–99)
HCT: 33 % — ABNORMAL LOW (ref 36.0–46.0)
Hemoglobin: 11.2 g/dL — ABNORMAL LOW (ref 12.0–15.0)
Potassium: 4.1 mmol/L (ref 3.5–5.1)
Sodium: 134 mmol/L — ABNORMAL LOW (ref 135–145)
TCO2: 21 mmol/L — ABNORMAL LOW (ref 22–32)

## 2022-05-08 LAB — CBC WITH DIFFERENTIAL/PLATELET
Abs Immature Granulocytes: 0.07 10*3/uL (ref 0.00–0.07)
Basophils Absolute: 0.1 10*3/uL (ref 0.0–0.1)
Basophils Relative: 0 %
Eosinophils Absolute: 0 10*3/uL (ref 0.0–0.5)
Eosinophils Relative: 0 %
HCT: 33.7 % — ABNORMAL LOW (ref 36.0–46.0)
Hemoglobin: 10.8 g/dL — ABNORMAL LOW (ref 12.0–15.0)
Immature Granulocytes: 1 %
Lymphocytes Relative: 6 %
Lymphs Abs: 1 10*3/uL (ref 0.7–4.0)
MCH: 29.5 pg (ref 26.0–34.0)
MCHC: 32 g/dL (ref 30.0–36.0)
MCV: 92.1 fL (ref 80.0–100.0)
Monocytes Absolute: 1.1 10*3/uL — ABNORMAL HIGH (ref 0.1–1.0)
Monocytes Relative: 7 %
Neutro Abs: 13 10*3/uL — ABNORMAL HIGH (ref 1.7–7.7)
Neutrophils Relative %: 86 %
Platelets: 402 10*3/uL — ABNORMAL HIGH (ref 150–400)
RBC: 3.66 MIL/uL — ABNORMAL LOW (ref 3.87–5.11)
RDW: 12.7 % (ref 11.5–15.5)
WBC: 15.3 10*3/uL — ABNORMAL HIGH (ref 4.0–10.5)
nRBC: 0 % (ref 0.0–0.2)

## 2022-05-08 LAB — LACTIC ACID, PLASMA
Lactic Acid, Venous: 1.5 mmol/L (ref 0.5–1.9)
Lactic Acid, Venous: 1.1 mmol/L (ref 0.5–1.9)

## 2022-05-08 LAB — URINALYSIS, MICROSCOPIC (REFLEX)

## 2022-05-08 LAB — CK: Total CK: 254 U/L — ABNORMAL HIGH (ref 38–234)

## 2022-05-08 MED ORDER — SODIUM CHLORIDE 0.9 % IV SOLN
3.0000 g | Freq: Two times a day (BID) | INTRAVENOUS | Status: DC
  Administered 2022-05-09: 3 g via INTRAVENOUS
  Filled 2022-05-08: qty 8

## 2022-05-08 MED ORDER — METHYLPREDNISOLONE SODIUM SUCC 125 MG IJ SOLR
125.0000 mg | Freq: Once | INTRAMUSCULAR | Status: AC
  Administered 2022-05-08: 125 mg via INTRAVENOUS
  Filled 2022-05-08: qty 2

## 2022-05-08 MED ORDER — SODIUM CHLORIDE 0.9 % IV SOLN
3.0000 g | Freq: Four times a day (QID) | INTRAVENOUS | Status: DC
  Administered 2022-05-08: 3 g via INTRAVENOUS
  Filled 2022-05-08: qty 8

## 2022-05-08 MED ORDER — METRONIDAZOLE 500 MG/100ML IV SOLN
500.0000 mg | Freq: Two times a day (BID) | INTRAVENOUS | Status: DC
  Administered 2022-05-08: 500 mg via INTRAVENOUS
  Filled 2022-05-08: qty 100

## 2022-05-08 MED ORDER — SODIUM CHLORIDE 0.9 % IV SOLN: Freq: Once | INTRAVENOUS | Status: AC

## 2022-05-08 MED ORDER — ACETAMINOPHEN 325 MG PO TABS
650.0000 mg | ORAL_TABLET | Freq: Once | ORAL | Status: AC
  Administered 2022-05-08: 650 mg via ORAL
  Filled 2022-05-08: qty 2

## 2022-05-08 MED ORDER — IOHEXOL 350 MG/ML SOLN
40.0000 mL | Freq: Once | INTRAVENOUS | Status: AC | PRN
  Administered 2022-05-08: 40 mL via INTRAVENOUS

## 2022-05-08 MED ORDER — LACTATED RINGERS IV SOLN: 150.0000 mL/h | INTRAVENOUS | Status: DC

## 2022-05-08 MED ORDER — SODIUM CHLORIDE 0.9 % IV BOLUS
1000.0000 mL | Freq: Once | INTRAVENOUS | Status: AC
  Administered 2022-05-08: 1000 mL via INTRAVENOUS

## 2022-05-08 NOTE — Assessment & Plan Note (Addendum)
-  SIRS criteria met with  elevated white blood cell count,       Component Value Date/Time   WBC 15.3 (H) 05/08/2022 1900   LYMPHSABS 1.0 05/08/2022 1900     Tachycardia  RR >20 Today's Vitals   05/08/22 2230 05/08/22 2245 05/08/22 2300 05/08/22 2319  BP: 103/75 (!) 117/56 (!) 100/47   Pulse: (!) 101 93 91 96  Resp: (!) 29 (!) 30 (!) 22 18  Temp:    98.2 F (36.8 C)  TempSrc:    Oral  SpO2: 98% 97% 96% 98%  Weight:      Height:         The recent clinical data is shown below. Vitals:   05/08/22 2230 05/08/22 2245 05/08/22 2300 05/08/22 2319  BP: 103/75 (!) 117/56 (!) 100/47   Pulse: (!) 101 93 91 96  Resp: (!) 29 (!) 30 (!) 22 18  Temp:    98.2 F (36.8 C)  TempSrc:    Oral  SpO2: 98% 97% 96% 98%  Weight:      Height:         -Most likely source being: Mandible-abscess   Patient meeting criteria for Severe sepsis with acute encephalopathy- Obtain serial lactic acid and procalcitonin level.  - Initiated IV antibiotics in ER: Antibiotics Given (last 72 hours)     Date/Time Action Medication Dose Rate   05/08/22 2019 New Bag/Given   Ampicillin-Sulbactam (UNASYN) 3 g in sodium chloride 0.9 % 100 mL IVPB 3 g 200 mL/hr   05/08/22 2340 New Bag/Given   metroNIDAZOLE (FLAGYL) IVPB 500 mg 500 mg 100 mL/hr       Will continue  on : Unasyn   - await results of blood and urine culture  - Rehydrate aggressively  Intravenous fluids were administered,  Patient to be transferred to tertiary care center where there is access to  maxillofacial surgeon 11:49 PM

## 2022-05-08 NOTE — Assessment & Plan Note (Addendum)
Iv antibiotics for tonight Unasyn  Recommend transfer to facility that has maxillofacial surgeon on stuff

## 2022-05-08 NOTE — Progress Notes (Signed)
Orthopedic Tech Progress Note Patient Details:  Wendy Jenkins 1932/06/06 [id number] Level 2 Trauma. Not needed Patient ID: Wendy Jenkins, female   DOB: 1932-07-04, 89 y.o.   MRN: [id number]  Wendy Jenkins 05/08/2022, 7:04 PM

## 2022-05-08 NOTE — Progress Notes (Signed)
   05/08/22 1906  Spiritual Encounters  Type of Visit Initial  Care provided to: Patient  Conversation partners present during encounter Nurse  Referral source Trauma page  Reason for visit Trauma  OnCall Visit Yes   Responded to [facility] [place] call.  PT unavailable as medical team provided care.  Chaplain looked for family coming in but none located.  Chaplain remains available by page.

## 2022-05-08 NOTE — ED Notes (Signed)
Trauma Response Nurse Documentation   Wendy Jenkins is a 89 y.o. female arriving to [facility] ED via [facility]  On Eliquis (apixaban) daily. Trauma was activated as a Level 2 by charge RN  based on the following trauma criteria Elderly patients > 65 with head trauma on anti-coagulation (excluding ASA).  Patient cleared for CT by Dr. Lockie Mola.  GCS 15.  History   Past Medical History:  Diagnosis Date   High cholesterol    Hypertension      Past Surgical History:  Procedure Laterality Date   APPENDECTOMY        Event Summary:  Pt was found on floor by daughter- unsure how long she had been on the floor- daughter stated that her mother has been falling more and has some occasional confusion.  Pt speaks PORTUGUESE. Daughter brought to bedside by this TRN.     [name]  Trauma Response RN  Please call TRN at [phone] for further assistance.

## 2022-05-08 NOTE — ED Notes (Signed)
[facility] called for transport to [facility] 

## 2022-05-08 NOTE — Consult Note (Signed)
Wendy Jenkins ZOX:[id number] DOB: 05/05/1932 DOA: 05/08/2022     PCP: Lenox Ponds, MD      Patient arrived to ER on 05/08/22 at 1852 Referred by Attending Virgina Norfolk, DO   Patient coming from:    home Lives alone,    Chief Complaint:   Chief Complaint  Patient presents with   Fall   Altered Mental Status   Facial Swelling    HPI: Wendy Jenkins is a 89 y.o. female with medical history significant of a.fib on eliquis, hypertension, hyperlipidemia, anemia    Presented with a fall Patient comes from home after a fall found on the floor by her daughter unknown for how long she has been down.  Has been having intermittent confusion.  May have had head injury.  She is on Eliquis for atrial fibrillation Trauma was activated Patient endorses right-sided chest wall pain and left-sided facial swelling but that has been going on for at least a week.  Otherwise no other complaints but patient unable to provide history even though [place] translator was used.  Daughter states she does get easily confused whenever she gets sick and she has been having decreased p.o. intake because her mouth has been hurting    Pt lives alone, daughter found her down    No results found for: "SARSCOV2NAA"      Regarding pertinent Chronic problems:    Hyperlipidemia -  on statins zocor     HTN on Lasix   obesity-   BMI Readings from Last 1 Encounters:  05/08/22 34.44 kg/m       A. Fib -  - CHA2DS2 vas score >3   current  on anticoagulation with Eliquis,       Chronic anemia - baseline hg Hemoglobin & Hematocrit  Recent Labs    05/08/22 1900 05/08/22 2018  HGB 10.8* 11.2*   Iron/TIBC/Ferritin/ %Sat    Component Value Date/Time   IRON 12 (L) 01/08/2016 1320   TIBC 477 (H) 01/08/2016 1320   FERRITIN 2 (L) 01/08/2016 1320   IRONPCTSAT 3 (L) 01/08/2016 1320     While in ER:  Ct showing abscess of left Mandible    Lab Orders         Blood culture (routine x 2)          Urine Culture         CBC with Differential         Comprehensive metabolic panel         CK         Urinalysis, Routine w reflex microscopic -Urine, Clean Catch         Lactic acid, plasma         Urinalysis, Microscopic (reflex)         I-stat chem 8, ed          Extensive soft tissue swelling with inflammatory changes involving the left face, centered at the angle of the left mandible. Superimposed 4.4 x 2.6 x 3.9 cm abscess as above. This appears to track laterally from a carious and impacted left mandibular molar, likely the source of infection.  CT HEAD CT of the head: No acute intracranial abnormality noted.  Mild atrophic changes are noted.  CT of cervical spine: No acute bony abnormality is noted.  Focal hematoma over the left mandible consistent with the recent injury.   CXR - NON acute   Following Medications were ordered in ER: Medications  Ampicillin-Sulbactam (UNASYN) 3 g in  sodium chloride 0.9 % 100 mL IVPB (has no administration in time range)  acetaminophen (TYLENOL) tablet 650 mg (650 mg Oral Given 05/08/22 2015)  sodium chloride 0.9 % bolus 1,000 mL (1,000 mLs Intravenous New Bag/Given 05/08/22 2017)  iohexol (OMNIPAQUE) 350 MG/ML injection 40 mL (40 mLs Intravenous Contrast Given 05/08/22 2047)    _______________________________________________________ ER Provider Called: [facility]     maxillofacial surgery waiting for [facility] ER to accept   ED Triage Vitals  Enc Vitals Group     BP 05/08/22 1858 (!) 144/82     Pulse Rate 05/08/22 1858 (!) 139     Resp 05/08/22 1858 (!) 39     Temp 05/08/22 1905 99.5 F (37.5 C)     Temp Source 05/08/22 1905 Oral     SpO2 05/08/22 1858 100 %     Weight 05/08/22 2036 159 lb 2.8 oz (72.2 kg)     Height 05/08/22 2036 4\' 9"  (1.448 m)     Head Circumference --      Peak Flow --      Pain Score --      Pain Loc --      Pain Edu? --      Excl. in GC? --   TMAX(24)@     _________________________________________ Significant  initial  Findings: Abnormal Labs Reviewed  CBC WITH DIFFERENTIAL/PLATELET - Abnormal; Notable for the following components:      Result Value   WBC 15.3 (*)    RBC 3.66 (*)    Hemoglobin 10.8 (*)    HCT 33.7 (*)    Platelets 402 (*)    Neutro Abs 13.0 (*)    Monocytes Absolute 1.1 (*)    All other components within normal limits  COMPREHENSIVE METABOLIC PANEL - Abnormal; Notable for the following components:   CO2 19 (*)    Glucose, Bld 144 (*)    BUN 29 (*)    Creatinine, Ser 1.56 (*)    Albumin 3.1 (*)    GFR, Estimated 32 (*)    Anion gap 16 (*)    All other components within normal limits  CK - Abnormal; Notable for the following components:   Total CK 254 (*)    All other components within normal limits  URINALYSIS, ROUTINE W REFLEX MICROSCOPIC - Abnormal; Notable for the following components:   Hgb urine dipstick TRACE (*)    Bilirubin Urine SMALL (*)    Ketones, ur 15 (*)    All other components within normal limits  URINALYSIS, MICROSCOPIC (REFLEX) - Abnormal; Notable for the following components:   Bacteria, UA FEW (*)    All other components within normal limits  I-STAT CHEM 8, ED - Abnormal; Notable for the following components:   Sodium 134 (*)    BUN 31 (*)    Creatinine, Ser 1.50 (*)    Glucose, Bld 135 (*)    TCO2 21 (*)    Hemoglobin 11.2 (*)    HCT 33.0 (*)    All other components within normal limits      _________________________ Troponin   Cardiac Panel (last 3 results) Recent Labs    05/08/22 1900  CKTOTAL 254*     ECG: Ordered Personally reviewed and interpreted by me showing: HR : 104 Rhythm: Sinus tachycardia Multiform ventricular premature complexes Low voltage, extremity leads QTC 443  BNP (last 3 results) No results for input(s): "BNP" in the last 8760 hours.   COVID-19 Labs  No results for input(s): "DDIMER", "FERRITIN", "  LDH", "CRP" in the last 72 hours.  No results found for: "SARSCOV2NAA"     ____________________ This patient meets SIRS Criteria and may be septic.    The recent clinical data is shown below. Vitals:   05/08/22 2050 05/08/22 2055 05/08/22 2100 05/08/22 2115  BP:   (!) 100/52 (!) 105/55  Pulse: 93 91 87 (!) 124  Resp: (!) 30 (!) 31 (!) 31 (!) 32  Temp:      TempSrc:      SpO2: 98% 99% 99% 99%  Weight:      Height:        WBC     Component Value Date/Time   WBC 15.3 (H) 05/08/2022 1900   LYMPHSABS 1.0 05/08/2022 1900   MONOABS 1.1 (H) 05/08/2022 1900   EOSABS 0.0 05/08/2022 1900   BASOSABS 0.1 05/08/2022 1900        Lactic Acid, Venous    Component Value Date/Time   LATICACIDVEN 1.1 05/08/2022 2110     Lactic Acid, Venous    Component Value Date/Time   LATICACIDVEN 1.1 05/08/2022 2110    Procalcitonin   Ordered      UA   no evidence of UTI     Urine analysis:    Component Value Date/Time   COLORURINE YELLOW 05/08/2022 2121   APPEARANCEUR CLEAR 05/08/2022 2121   LABSPEC 1.020 05/08/2022 2121   PHURINE 5.5 05/08/2022 2121   GLUCOSEU NEGATIVE 05/08/2022 2121   HGBUR TRACE (A) 05/08/2022 2121   BILIRUBINUR SMALL (A) 05/08/2022 2121   KETONESUR 15 (A) 05/08/2022 2121   PROTEINUR NEGATIVE 05/08/2022 2121   NITRITE NEGATIVE 05/08/2022 2121   LEUKOCYTESUR NEGATIVE 05/08/2022 2121    No results found for this or any previous visit.  ABX started Antibiotics Given (last 72 hours)     Date/Time Action Medication Dose Rate   05/08/22 2019 New Bag/Given   Ampicillin-Sulbactam (UNASYN) 3 g in sodium chloride 0.9 % 100 mL IVPB 3 g 200 mL/hr        __________________________________________________________ Recent Labs  Lab 05/08/22 1900 05/08/22 2018  NA 135 134*  K 4.1 4.1  CO2 19*  --   GLUCOSE 144* 135*  BUN 29* 31*  CREATININE 1.56* 1.50*  CALCIUM 10.0  --     Cr Up from baseline see below Lab Results  Component Value Date   CREATININE 1.50 (H) 05/08/2022   CREATININE 1.56 (H) 05/08/2022   CREATININE 1.02 (H)  01/09/2016    Recent Labs  Lab 05/08/22 1900  AST 20  ALT 17  ALKPHOS 97  BILITOT 0.6  PROT 7.2  ALBUMIN 3.1*   Lab Results  Component Value Date   CALCIUM 10.0 05/08/2022    Plt: Lab Results  Component Value Date   PLT 402 (H) 05/08/2022       Recent Labs  Lab 05/08/22 1900 05/08/22 2018  WBC 15.3*  --   NEUTROABS 13.0*  --   HGB 10.8* 11.2*  HCT 33.7* 33.0*  MCV 92.1  --   PLT 402*  --     HG/HCT  stable,     Component Value Date/Time   HGB 11.2 (L) 05/08/2022 2018   HCT 33.0 (L) 05/08/2022 2018   MCV 92.1 05/08/2022 1900    _______________________________________________ Hospitalist was called for consult in the setting of patient having mandible abscess recommendation transferred to tertiary care center they have maxillofacial surgery will cover    Orders Placed This Encounter  Procedures   Blood culture (routine  x 2)   Urine Culture   DG Chest Portable 1 View   DG Pelvis Portable   CT Head Wo Contrast   CT Soft Tissue Neck W Contrast   CT C-SPINE NO CHARGE   CBC with Differential   Comprehensive metabolic panel   CK   Urinalysis, Routine w reflex microscopic -Urine, Clean Catch   Lactic acid, plasma   Urinalysis, Microscopic (reflex)   Consult for Unassigned Medical Admission   I-stat chem 8, ed   EKG 12-Lead   ED EKG     OTHER Significant initial  Findings:  labs showing:     DM  labs:  HbA1C: No results for input(s): "HGBA1C" in the last 8760 hours.     CBG (last 3)  No results for input(s): "GLUCAP" in the last 72 hours.        Cultures: No results found for: "SDES", "SPECREQUEST", "CULT", "REPTSTATUS"   Radiological Exams on Admission: CT Soft Tissue Neck W Contrast  Result Date: 05/08/2022 CLINICAL DATA:  Initial evaluation for soft tissue infection. Facial swelling. EXAM: CT NECK WITH CONTRAST TECHNIQUE: Multidetector CT imaging of the neck was performed using the standard protocol following the bolus administration of  intravenous contrast. RADIATION DOSE REDUCTION: This exam was performed according to the departmental dose-optimization program which includes automated exposure control, adjustment of the mA and/or kV according to patient size and/or use of iterative reconstruction technique. CONTRAST:  40mL OMNIPAQUE IOHEXOL 350 MG/ML SOLN COMPARISON:  None Available. FINDINGS: Pharynx and larynx: Oral cavity within normal limits. Oropharynx and nasopharynx within normal limits. Hazy stranding within the left parapharyngeal 5 related to the inflammatory process within the left face. No retropharyngeal collection. Negative epiglottis. Vallecula clear. Hypopharynx and supraglottic larynx within normal limits. Negative glottis. Subglottic airway clear. Salivary glands: Extensive soft tissue swelling with inflammatory changes seen involving the left face, centered at the angle of the left mandible. Left masseter muscle is markedly thickened and edematous. Superimposed hypodense collection measuring approximately 4.4 x 2.6 x 3.9 cm, consistent with abscess (series 3, image 52). This appears to track laterally from a carious and impacted left mandibular molar (series 6, image 39), likely the source of infection. Inflammatory process intimately associated with the left parotid gland, however, the gland itself is felt to be secondarily involved and likely not the source of infection. Hazy inflammatory stranding extends to involve the left submandibular and parapharyngeal spaces. Left submandibular gland otherwise normal. Right parotid and submandibular glands are normal. Thyroid: 9 mm right thyroid nodule, of doubtful significance given size and patient age, no follow-up imaging recommended (ref: J Am Coll Radiol. 2015 Feb;12(2): 143-50). Lymph nodes: No enlarged or pathologic adenopathy in the neck seen at this time. Vascular: Aortic atherosclerosis. Normal intravascular enhancement seen throughout the neck. Limited intracranial:  Unremarkable. Visualized orbits: Chronic changes of lens displacement noted at the right globe. Globes and orbital soft tissues demonstrate no acute finding. Mastoids and visualized paranasal sinuses: Paranasal sinuses are largely clear. Trace right mastoid effusion noted, of doubtful significance. Middle ear cavities are clear. Skeleton: No discrete or worrisome osseous lesions. Mild chronic compression deformity noted at the T2 vertebral body. Upper chest: No acute finding. Other: None. IMPRESSION: 1. Extensive soft tissue swelling with inflammatory changes involving the left face, centered at the angle of the left mandible. Superimposed 4.4 x 2.6 x 3.9 cm abscess as above. This appears to track laterally from a carious and impacted left mandibular molar, likely the source of infection. 2. No other  acute abnormality within the neck. Aortic Atherosclerosis (ICD10-I70.0). Electronically Signed   By: Rise Mu M.D.   On: 05/08/2022 21:19   CT Head Wo Contrast  Result Date: 05/08/2022 CLINICAL DATA:  Recent fall with left-sided facial swelling, initial encounter EXAM: CT HEAD WITHOUT CONTRAST CT CERVICAL SPINE WITHOUT CONTRAST TECHNIQUE: Multidetector CT imaging of the head and cervical spine was performed following the standard protocol without intravenous contrast. Multiplanar CT image reconstructions of the cervical spine were also generated. RADIATION DOSE REDUCTION: This exam was performed according to the departmental dose-optimization program which includes automated exposure control, adjustment of the mA and/or kV according to patient size and/or use of iterative reconstruction technique. COMPARISON:  None Available. FINDINGS: CT HEAD FINDINGS Brain: Mild atrophic changes are noted. No findings to suggest acute hemorrhage, acute infarction or space-occupying mass lesion are noted. Vascular: No hyperdense vessel or unexpected calcification. Skull: Normal. Negative for fracture or focal lesion.  Sinuses/Orbits: No acute finding. Other: Soft tissue swelling is noted along the left mandible consistent with the recent injury. CT CERVICAL SPINE FINDINGS Alignment: Within normal limits. Skull base and vertebrae: 7 cervical segments are well visualized. Vertebral body height is well maintained. Mild disc space narrowing is noted at C5-6. Facet hypertrophic changes are noted. No acute fracture or acute facet abnormality is noted. The odontoid is within normal limits. Soft tissues and spinal canal: Focal hematoma is noted over the left mandible laterally consistent with the recent injury. This measures at least 4 cm in dimension. No other soft tissue abnormality is noted. Upper chest: Visualized lung apices are unremarkable. Other: None IMPRESSION: CT of the head: No acute intracranial abnormality noted. Mild atrophic changes are noted. CT of cervical spine: No acute bony abnormality is noted. Focal hematoma over the left mandible consistent with the recent injury. Electronically Signed   By: Alcide Clever M.D.   On: 05/08/2022 20:58   CT C-SPINE NO CHARGE  Result Date: 05/08/2022 CLINICAL DATA:  Recent fall with left-sided facial swelling, initial encounter EXAM: CT HEAD WITHOUT CONTRAST CT CERVICAL SPINE WITHOUT CONTRAST TECHNIQUE: Multidetector CT imaging of the head and cervical spine was performed following the standard protocol without intravenous contrast. Multiplanar CT image reconstructions of the cervical spine were also generated. RADIATION DOSE REDUCTION: This exam was performed according to the departmental dose-optimization program which includes automated exposure control, adjustment of the mA and/or kV according to patient size and/or use of iterative reconstruction technique. COMPARISON:  None Available. FINDINGS: CT HEAD FINDINGS Brain: Mild atrophic changes are noted. No findings to suggest acute hemorrhage, acute infarction or space-occupying mass lesion are noted. Vascular: No hyperdense  vessel or unexpected calcification. Skull: Normal. Negative for fracture or focal lesion. Sinuses/Orbits: No acute finding. Other: Soft tissue swelling is noted along the left mandible consistent with the recent injury. CT CERVICAL SPINE FINDINGS Alignment: Within normal limits. Skull base and vertebrae: 7 cervical segments are well visualized. Vertebral body height is well maintained. Mild disc space narrowing is noted at C5-6. Facet hypertrophic changes are noted. No acute fracture or acute facet abnormality is noted. The odontoid is within normal limits. Soft tissues and spinal canal: Focal hematoma is noted over the left mandible laterally consistent with the recent injury. This measures at least 4 cm in dimension. No other soft tissue abnormality is noted. Upper chest: Visualized lung apices are unremarkable. Other: None IMPRESSION: CT of the head: No acute intracranial abnormality noted. Mild atrophic changes are noted. CT of cervical spine: No acute bony  abnormality is noted. Focal hematoma over the left mandible consistent with the recent injury. Electronically Signed   By: Alcide Clever M.D.   On: 05/08/2022 20:58   DG Pelvis Portable  Result Date: 05/08/2022 CLINICAL DATA:  Recent fall with pelvic pain, initial encounter EXAM: PORTABLE PELVIS 1 VIEWS COMPARISON:  None Available. FINDINGS: Pelvic ring is intact. No acute fracture or dislocation is noted. Degenerative changes of lumbar spine are seen. No soft tissue abnormality is noted. IMPRESSION: No acute abnormality noted. Electronically Signed   By: Alcide Clever M.D.   On: 05/08/2022 19:39   DG Chest Portable 1 View  Result Date: 05/08/2022 CLINICAL DATA:  Chest pain following fall, initial encounter EXAM: PORTABLE CHEST 1 VIEW COMPARISON:  10/17/2021 FINDINGS: Cardiac shadow is enlarged in size but stable. Aortic calcifications are noted. Lungs are well aerated bilaterally. No acute bony abnormality is seen. IMPRESSION: No active disease.  Electronically Signed   By: Alcide Clever M.D.   On: 05/08/2022 19:36   _______________________________________________________________________________________________________ Latest  Blood pressure (!) 105/55, pulse (!) 124, temperature 99.5 F (37.5 C), temperature source Rectal, resp. rate (!) 32, height 4\' 9"  (1.448 m), weight 72.2 kg, SpO2 99 %.   Vitals  labs and radiology finding personally reviewed  Review of Systems:    Pertinent positives include:   chills, fatigue, facial swelling Constitutional:  No weight loss, night sweats, Fevers, weight loss  HEENT:  No headaches, Difficulty swallowing,Tooth/dental problems,Sore throat,  No sneezing, itching, ear ache, nasal congestion, post nasal drip,  Cardio-vascular:  No chest pain, Orthopnea, PND, anasarca, dizziness, palpitations.no Bilateral lower extremity swelling  GI:  No heartburn, indigestion, abdominal pain, nausea, vomiting, diarrhea, change in bowel habits, loss of appetite, melena, blood in stool, hematemesis Resp:  no shortness of breath at rest. No dyspnea on exertion, No excess mucus, no productive cough, No non-productive cough, No coughing up of blood.No change in color of mucus.No wheezing. Skin:  no rash or lesions. No jaundice GU:  no dysuria, change in color of urine, no urgency or frequency. No straining to urinate.  No flank pain.  Musculoskeletal:  No joint pain or no joint swelling. No decreased range of motion. No back pain.  Psych:  No change in mood or affect. No depression or anxiety. No memory loss.  Neuro: no localizing neurological complaints, no tingling, no weakness, no double vision, no gait abnormality, no slurred speech, no confusion  All systems reviewed and apart from HOPI all are negative _______________________________________________________________________________________________ Past Medical History:   Past Medical History:  Diagnosis Date   High cholesterol    Hypertension        Past Surgical History:  Procedure Laterality Date   APPENDECTOMY      Social History:  Ambulatory   walker        reports that she has quit smoking. She has never used smokeless tobacco. She reports that she does not drink alcohol. No history on file for drug use.     Family History:  Family History  Problem Relation Age of Onset   Leukemia Mother    Colon cancer Sister    ______________________________________________________________________________________________ Allergies: Allergies  Allergen Reactions   Oxybutynin Other (See Comments)    confusion   Sulfamethoxazole Rash     Prior to Admission medications   Medication Sig Start Date End Date Taking? Authorizing Provider  acetaminophen (TYLENOL) 500 MG tablet Take 500 mg by mouth every 6 (six) hours as needed for mild pain. 10/11/21  Yes  [provider]  apixaban (ELIQUIS) 2.5 MG TABS tablet Take 2.5 mg by mouth 2 (two) times daily. 03/09/22  Yes [provider]  cholecalciferol (VITAMIN D3) 25 MCG (1000 UNIT) tablet Take 1,000 Units by mouth daily. 08/23/16  Yes [provider]  escitalopram (LEXAPRO) 20 MG tablet Take 20 mg by mouth daily. 05/05/16  Yes [provider]  magnesium oxide (MAG-OX) 400 MG tablet Take 400 mg by mouth 2 (two) times daily. 03/23/22  Yes [provider]  MELATONIN PO Take 3 mg by mouth at bedtime.   Yes [provider]  metoprolol tartrate (LOPRESSOR) 25 MG tablet Take 25 mg by mouth 3 (three) times daily. Family members did state she is taking three times a day   Yes [provider]  sodium bicarbonate 650 MG tablet Take 650 mg by mouth 2 (two) times daily. 03/22/22 09/18/22 Yes [provider]    ___________________________________________________________________________________________________ Physical Exam:    05/08/2022    9:15 PM 05/08/2022    9:00 PM 05/08/2022    8:55 PM  Vitals with BMI  Systolic 105 100    Diastolic 55 52   Pulse 124 87 91     1. General:  in No  Acute distress    Chronically ill   -appearing 2. Psychological: Alert and   Oriented to self only , confused 3. Head/ENT:      Dry Mucous Membranes                          Head Non traumatic, neck supple                            Poor Dentition 4. SKIN:  decreased Skin turgor,  Skin clean Dry and intact no rash    5. Heart: Regular rate and rhythm no  Murmur, no Rub or gallop 6. Lungs  no wheezes or crackles   7. Abdomen: Soft,  non-tender,  obese  bowel sounds present 8. Lower extremities: no clubbing, cyanosis, no  edema 9. Neurologically Grossly intact, moving all 4 extremities equally   10. MSK: Normal range of motion    Chart has been reviewed  ______________________________________________________________________  Assessment/Plan 89 y.o. female with medical history significant of a.fib on eliquis, hypertension, hyperlipidemia, anemia Here with mandibular abscess   Present on Admission:  Symptomatic anemia  HLD (hyperlipidemia)  Essential hypertension  Mandibular abscess  AKI (acute kidney injury) (HCC)  Sepsis (HCC)     Symptomatic anemia Denies bleeding obtain anemia panel, in the past has refused Gi work up  HLD (hyperlipidemia) Continue statin  Essential hypertension Given soft bp allow permissive HTN  Mandibular abscess Iv antibiotics for tonight Unasyn  Recommend transfer to facility that has maxillofacial surgeon on stuff   AKI (acute kidney injury) (HCC) In the setting of being found down  Sepsis (HCC)  -SIRS criteria met with  elevated white blood cell count,       Component Value Date/Time   WBC 15.3 (H) 05/08/2022 1900   LYMPHSABS 1.0 05/08/2022 1900     Tachycardia  RR >20 Today's Vitals   05/08/22 2230 05/08/22 2245 05/08/22 2300 05/08/22 2319  BP: 103/75 (!) 117/56 (!) 100/47   Pulse: (!) 101 93 91 96  Resp: (!) 29 (!) 30 (!) 22 18  Temp:    98.2 F (36.8 C)   TempSrc:    Oral  SpO2: 98%  97% 96% 98%  Weight:      Height:         The recent clinical data is shown below. Vitals:   05/08/22 2230 05/08/22 2245 05/08/22 2300 05/08/22 2319  BP: 103/75 (!) 117/56 (!) 100/47   Pulse: (!) 101 93 91 96  Resp: (!) 29 (!) 30 (!) 22 18  Temp:    98.2 F (36.8 C)  TempSrc:    Oral  SpO2: 98% 97% 96% 98%  Weight:      Height:         -Most likely source being: Mandible-abscess   Patient meeting criteria for Severe sepsis with acute encephalopathy- Obtain serial lactic acid and procalcitonin level.  - Initiated IV antibiotics in ER: Antibiotics Given (last 72 hours)     Date/Time Action Medication Dose Rate   05/08/22 2019 New Bag/Given   Ampicillin-Sulbactam (UNASYN) 3 g in sodium chloride 0.9 % 100 mL IVPB 3 g 200 mL/hr   05/08/22 2340 New Bag/Given   metroNIDAZOLE (FLAGYL) IVPB 500 mg 500 mg 100 mL/hr       Will continue  on : Unasyn   - await results of blood and urine culture  - Rehydrate aggressively  Intravenous fluids were administered,  Patient to be transferred to tertiary care center where there is access to  maxillofacial surgeon 11:49 PM   Paroxysmal A-fib (HCC) Would hold Eliquis as patient likely need surgical intervention   Other plan as per orders.  DVT prophylaxis:  SCD     Family Communication:   Family   at  Bedside  plan of care was discussed  with   Daughter   Diet npo    Therisa Doyne 05/08/2022, 10:56 PM ***  [facility]     after 2 AM please page floor coverage PA If 7AM-7PM, please contact the day team taking care of the patient using [email or web address]

## 2022-05-08 NOTE — Subjective & Objective (Signed)
Patient comes from home after a fall found on the floor by her daughter unknown for how long she has been down.  Has been having intermittent confusion.  May have had head injury.  She is on Eliquis for atrial fibrillation Trauma was activated Patient endorses right-sided chest wall pain and left-sided facial swelling but that has been going on for at least a week.  Otherwise no other complaints but patient unable to provide history even though [place] translator was used.  Daughter states she does get easily confused whenever she gets sick and she has been having decreased p.o. intake because her mouth has been hurting

## 2022-05-08 NOTE — Assessment & Plan Note (Signed)
In the setting of being found down

## 2022-05-08 NOTE — Assessment & Plan Note (Signed)
Denies bleeding obtain anemia panel, in the past has refused Gi work up

## 2022-05-08 NOTE — ED Triage Notes (Signed)
Pt BIB [facility] from home as a level 2 fall on blood thinners. Pt was found on the floor by her daughter. Unknown downtime. Pt has intermittent confusion. Pt said she felt like she did hit her head. Pt is on eliquis for Afib. Pt speaks portuguese.

## 2022-05-08 NOTE — ED Notes (Signed)
Patient transported to CT 

## 2022-05-08 NOTE — ED Provider Notes (Signed)
[place] EMERGENCY DEPARTMENT AT [facility] Provider Note   CSN: [id number] Arrival date & time: 05/08/22  4098     History  Chief Complaint  Patient presents with   Fall   Altered Mental Status   Facial Swelling    Wendy Jenkins is a 89 y.o. female.  Patient here after a fall.  Lives by herself.  History of colon cancer and A-fib.  She is on Eliquis.  Daughter last talked to her last night and she was normal.  She went over to see her today and found her on the floor confused.  Patient does not have any complaints except for may be some right-sided chest wall pain.  She has had left-sided facial swelling now for over a week.  She has not wanted to come for evaluation for that.  She denies any cough or pain with urination.  Patient primarily speaks [place].  Interpreter was used.  She cannot really give details about what happened today.  She is not making sense at times with her story.  Daughter states that when she does have an illness she will get easily confused.  She has not been eating or drinking much because of her mouth pain.  The history is provided by the patient and a relative.       Home Medications Prior to Admission medications   Medication Sig Start Date End Date Taking? Authorizing Provider  acetaminophen (TYLENOL) 500 MG tablet Take 500 mg by mouth every 6 (six) hours as needed for mild pain. 10/11/21  Yes [provider]  apixaban (ELIQUIS) 2.5 MG TABS tablet Take 2.5 mg by mouth 2 (two) times daily. 03/09/22  Yes [provider]  cholecalciferol (VITAMIN D3) 25 MCG (1000 UNIT) tablet Take 1,000 Units by mouth daily. 08/23/16  Yes [provider]  escitalopram (LEXAPRO) 20 MG tablet Take 20 mg by mouth daily. 05/05/16  Yes [provider]  magnesium oxide (MAG-OX) 400 MG tablet Take 400 mg by mouth 2 (two) times daily. 03/23/22  Yes [provider]  MELATONIN PO Take 3 mg by mouth at bedtime.   Yes [provider]  metoprolol tartrate (LOPRESSOR) 25 MG tablet Take 25 mg by mouth 3 (three) times daily. Family members did state she is taking three times a day   Yes [provider]  sodium bicarbonate 650 MG tablet Take 650 mg by mouth 2 (two) times daily. 03/22/22 09/18/22 Yes [provider]      Allergies    Oxybutynin and Sulfamethoxazole    Review of Systems   Review of Systems  Physical Exam Updated Vital Signs  ED Triage Vitals  Enc Vitals Group     BP 05/08/22 1858 (!) 144/82     Pulse Rate 05/08/22 1858 (!) 139     Resp 05/08/22 1858 (!) 39     Temp 05/08/22 1905 99.5 F (37.5 C)     Temp Source 05/08/22 1905 Oral     SpO2 05/08/22 1858 100 %     Weight 05/08/22 2036 159 lb 2.8 oz (72.2 kg)     Height 05/08/22 2036 4\' 9"  (1.448 m)     Head Circumference --      Peak Flow --      Pain Score --      Pain Loc --      Pain Edu? --      Excl. in GC? --     Physical Exam Vitals and nursing  note reviewed.  Constitutional:      General: She is not in acute distress.    Appearance: She is well-developed. She is not ill-appearing.  HENT:     Head: Normocephalic and atraumatic.     Comments: There is fairly significant swelling of the left side of the face that is firm and indurated, does not appear to be any major submandibular swelling, swelling is mostly in the preauricular space, no obvious trismus, no drooling, mucous membranes are very dry    Nose: Nose normal.  Eyes:     Extraocular Movements: Extraocular movements intact.     Conjunctiva/sclera: Conjunctivae normal.     Pupils: Pupils are equal, round, and reactive to light.  Cardiovascular:     Rate and Rhythm: Normal rate and regular rhythm.     Pulses: Normal pulses.     Heart sounds: No murmur heard. Pulmonary:     Effort: Pulmonary effort is normal. No respiratory distress.     Breath sounds: Normal breath sounds.  Abdominal:     General: Abdomen is flat.     Palpations: Abdomen is  soft.     Tenderness: There is no abdominal tenderness.  Musculoskeletal:        General: No swelling.     Cervical back: Neck supple.  Skin:    General: Skin is warm and dry.     Capillary Refill: Capillary refill takes less than 2 seconds.  Neurological:     General: No focal deficit present.     Mental Status: She is alert.     Comments: She appears to have normal strength and sensation throughout, she says some nonsensical things or some things that are clearly not true but she is able to answer yes/no questions, pupils are equal and reactive  Psychiatric:        Mood and Affect: Mood normal.     ED Results / Procedures / Treatments   Labs (all labs ordered are listed, but only abnormal results are displayed) Labs Reviewed  CBC WITH DIFFERENTIAL/PLATELET - Abnormal; Notable for the following components:      Result Value   WBC 15.3 (*)    RBC 3.66 (*)    Hemoglobin 10.8 (*)    HCT 33.7 (*)    Platelets 402 (*)    Neutro Abs 13.0 (*)    Monocytes Absolute 1.1 (*)    All other components within normal limits  COMPREHENSIVE METABOLIC PANEL - Abnormal; Notable for the following components:   CO2 19 (*)    Glucose, Bld 144 (*)    BUN 29 (*)    Creatinine, Ser 1.56 (*)    Albumin 3.1 (*)    GFR, Estimated 32 (*)    Anion gap 16 (*)    All other components within normal limits  CK - Abnormal; Notable for the following components:   Total CK 254 (*)    All other components within normal limits  URINALYSIS, ROUTINE W REFLEX MICROSCOPIC - Abnormal; Notable for the following components:   Hgb urine dipstick TRACE (*)    Bilirubin Urine SMALL (*)    Ketones, ur 15 (*)    All other components within normal limits  URINALYSIS, MICROSCOPIC (REFLEX) - Abnormal; Notable for the following components:   Bacteria, UA FEW (*)    All other components within normal limits  I-STAT CHEM 8, ED - Abnormal; Notable for the following components:   Sodium 134 (*)    BUN 31 (*)  Creatinine, Ser 1.50 (*)    Glucose, Bld 135 (*)    TCO2 21 (*)    Hemoglobin 11.2 (*)    HCT 33.0 (*)    All other components within normal limits  CULTURE, BLOOD (ROUTINE X 2)  CULTURE, BLOOD (ROUTINE X 2)  URINE CULTURE  LACTIC ACID, PLASMA  LACTIC ACID, PLASMA  MAGNESIUM  PHOSPHORUS  OSMOLALITY  TSH  VITAMIN B12  FOLATE  IRON AND TIBC  FERRITIN  RETICULOCYTES  CBC WITH DIFFERENTIAL/PLATELET  PROCALCITONIN  TROPONIN I (HIGH SENSITIVITY)    EKG EKG Interpretation  Date/Time:  Saturday May 08 2022 18:58:58 EDT Ventricular Rate:  105 PR Interval:  181 QRS Duration: 83 QT Interval:  335 QTC Calculation: 443 R Axis:   31 Text Interpretation: Sinus tachycardia Multiform ventricular premature complexes Low voltage, extremity leads Confirmed by Virgina Norfolk ([id number]) on 05/08/2022 7:17:35 PM  Radiology CT Soft Tissue Neck W Contrast  Result Date: 05/08/2022 CLINICAL DATA:  Initial evaluation for soft tissue infection. Facial swelling. EXAM: CT NECK WITH CONTRAST TECHNIQUE: Multidetector CT imaging of the neck was performed using the standard protocol following the bolus administration of intravenous contrast. RADIATION DOSE REDUCTION: This exam was performed according to the departmental dose-optimization program which includes automated exposure control, adjustment of the mA and/or kV according to patient size and/or use of iterative reconstruction technique. CONTRAST:  40mL OMNIPAQUE IOHEXOL 350 MG/ML SOLN COMPARISON:  None Available. FINDINGS: Pharynx and larynx: Oral cavity within normal limits. Oropharynx and nasopharynx within normal limits. Hazy stranding within the left parapharyngeal 5 related to the inflammatory process within the left face. No retropharyngeal collection. Negative epiglottis. Vallecula clear. Hypopharynx and supraglottic larynx within normal limits. Negative glottis. Subglottic airway clear. Salivary glands: Extensive soft tissue swelling with inflammatory  changes seen involving the left face, centered at the angle of the left mandible. Left masseter muscle is markedly thickened and edematous. Superimposed hypodense collection measuring approximately 4.4 x 2.6 x 3.9 cm, consistent with abscess (series 3, image 52). This appears to track laterally from a carious and impacted left mandibular molar (series 6, image 39), likely the source of infection. Inflammatory process intimately associated with the left parotid gland, however, the gland itself is felt to be secondarily involved and likely not the source of infection. Hazy inflammatory stranding extends to involve the left submandibular and parapharyngeal spaces. Left submandibular gland otherwise normal. Right parotid and submandibular glands are normal. Thyroid: 9 mm right thyroid nodule, of doubtful significance given size and patient age, no follow-up imaging recommended (ref: J Am Coll Radiol. 2015 Feb;12(2): 143-50). Lymph nodes: No enlarged or pathologic adenopathy in the neck seen at this time. Vascular: Aortic atherosclerosis. Normal intravascular enhancement seen throughout the neck. Limited intracranial: Unremarkable. Visualized orbits: Chronic changes of lens displacement noted at the right globe. Globes and orbital soft tissues demonstrate no acute finding. Mastoids and visualized paranasal sinuses: Paranasal sinuses are largely clear. Trace right mastoid effusion noted, of doubtful significance. Middle ear cavities are clear. Skeleton: No discrete or worrisome osseous lesions. Mild chronic compression deformity noted at the T2 vertebral body. Upper chest: No acute finding. Other: None. IMPRESSION: 1. Extensive soft tissue swelling with inflammatory changes involving the left face, centered at the angle of the left mandible. Superimposed 4.4 x 2.6 x 3.9 cm abscess as above. This appears to track laterally from a carious and impacted left mandibular molar, likely the source of infection. 2. No other acute  abnormality within the neck. Aortic Atherosclerosis (ICD10-I70.0). Electronically  Signed   By: Rise Mu M.D.   On: 05/08/2022 21:19   CT Head Wo Contrast  Result Date: 05/08/2022 CLINICAL DATA:  Recent fall with left-sided facial swelling, initial encounter EXAM: CT HEAD WITHOUT CONTRAST CT CERVICAL SPINE WITHOUT CONTRAST TECHNIQUE: Multidetector CT imaging of the head and cervical spine was performed following the standard protocol without intravenous contrast. Multiplanar CT image reconstructions of the cervical spine were also generated. RADIATION DOSE REDUCTION: This exam was performed according to the departmental dose-optimization program which includes automated exposure control, adjustment of the mA and/or kV according to patient size and/or use of iterative reconstruction technique. COMPARISON:  None Available. FINDINGS: CT HEAD FINDINGS Brain: Mild atrophic changes are noted. No findings to suggest acute hemorrhage, acute infarction or space-occupying mass lesion are noted. Vascular: No hyperdense vessel or unexpected calcification. Skull: Normal. Negative for fracture or focal lesion. Sinuses/Orbits: No acute finding. Other: Soft tissue swelling is noted along the left mandible consistent with the recent injury. CT CERVICAL SPINE FINDINGS Alignment: Within normal limits. Skull base and vertebrae: 7 cervical segments are well visualized. Vertebral body height is well maintained. Mild disc space narrowing is noted at C5-6. Facet hypertrophic changes are noted. No acute fracture or acute facet abnormality is noted. The odontoid is within normal limits. Soft tissues and spinal canal: Focal hematoma is noted over the left mandible laterally consistent with the recent injury. This measures at least 4 cm in dimension. No other soft tissue abnormality is noted. Upper chest: Visualized lung apices are unremarkable. Other: None IMPRESSION: CT of the head: No acute intracranial abnormality noted.  Mild atrophic changes are noted. CT of cervical spine: No acute bony abnormality is noted. Focal hematoma over the left mandible consistent with the recent injury. Electronically Signed   By: Alcide Clever M.D.   On: 05/08/2022 20:58   CT C-SPINE NO CHARGE  Result Date: 05/08/2022 CLINICAL DATA:  Recent fall with left-sided facial swelling, initial encounter EXAM: CT HEAD WITHOUT CONTRAST CT CERVICAL SPINE WITHOUT CONTRAST TECHNIQUE: Multidetector CT imaging of the head and cervical spine was performed following the standard protocol without intravenous contrast. Multiplanar CT image reconstructions of the cervical spine were also generated. RADIATION DOSE REDUCTION: This exam was performed according to the departmental dose-optimization program which includes automated exposure control, adjustment of the mA and/or kV according to patient size and/or use of iterative reconstruction technique. COMPARISON:  None Available. FINDINGS: CT HEAD FINDINGS Brain: Mild atrophic changes are noted. No findings to suggest acute hemorrhage, acute infarction or space-occupying mass lesion are noted. Vascular: No hyperdense vessel or unexpected calcification. Skull: Normal. Negative for fracture or focal lesion. Sinuses/Orbits: No acute finding. Other: Soft tissue swelling is noted along the left mandible consistent with the recent injury. CT CERVICAL SPINE FINDINGS Alignment: Within normal limits. Skull base and vertebrae: 7 cervical segments are well visualized. Vertebral body height is well maintained. Mild disc space narrowing is noted at C5-6. Facet hypertrophic changes are noted. No acute fracture or acute facet abnormality is noted. The odontoid is within normal limits. Soft tissues and spinal canal: Focal hematoma is noted over the left mandible laterally consistent with the recent injury. This measures at least 4 cm in dimension. No other soft tissue abnormality is noted. Upper chest: Visualized lung apices are  unremarkable. Other: None IMPRESSION: CT of the head: No acute intracranial abnormality noted. Mild atrophic changes are noted. CT of cervical spine: No acute bony abnormality is noted. Focal hematoma over the left mandible  consistent with the recent injury. Electronically Signed   By: Alcide Clever M.D.   On: 05/08/2022 20:58   DG Pelvis Portable  Result Date: 05/08/2022 CLINICAL DATA:  Recent fall with pelvic pain, initial encounter EXAM: PORTABLE PELVIS 1 VIEWS COMPARISON:  None Available. FINDINGS: Pelvic ring is intact. No acute fracture or dislocation is noted. Degenerative changes of lumbar spine are seen. No soft tissue abnormality is noted. IMPRESSION: No acute abnormality noted. Electronically Signed   By: Alcide Clever M.D.   On: 05/08/2022 19:39   DG Chest Portable 1 View  Result Date: 05/08/2022 CLINICAL DATA:  Chest pain following fall, initial encounter EXAM: PORTABLE CHEST 1 VIEW COMPARISON:  10/17/2021 FINDINGS: Cardiac shadow is enlarged in size but stable. Aortic calcifications are noted. Lungs are well aerated bilaterally. No acute bony abnormality is seen. IMPRESSION: No active disease. Electronically Signed   By: Alcide Clever M.D.   On: 05/08/2022 19:36    Procedures Procedures    Medications Ordered in ED Medications  Ampicillin-Sulbactam (UNASYN) 3 g in sodium chloride 0.9 % 100 mL IVPB (has no administration in time range)  metroNIDAZOLE (FLAGYL) IVPB 500 mg (500 mg Intravenous New Bag/Given 05/08/22 2340)  acetaminophen (TYLENOL) tablet 650 mg (650 mg Oral Given 05/08/22 2015)  sodium chloride 0.9 % bolus 1,000 mL (0 mLs Intravenous Stopped 05/08/22 2309)  iohexol (OMNIPAQUE) 350 MG/ML injection 40 mL (40 mLs Intravenous Contrast Given 05/08/22 2047)  0.9 %  sodium chloride infusion ( Intravenous New Bag/Given 05/08/22 2326)  methylPREDNISolone sodium succinate (SOLU-MEDROL) 125 mg/2 mL injection 125 mg (125 mg Intravenous Given 05/08/22 2337)    ED Course/ Medical Decision  Making/ A&P                             Medical Decision Making Amount and/or Complexity of Data Reviewed Labs: ordered. Radiology: ordered.  Risk OTC drugs. Prescription drug management.   Wendy Jenkins is here after a fall.  She has had some confusion as well.  She arrives with unremarkable vitals.  Low-grade temp of 99.5.  Patient was last talked to last night when she was at her baseline per family.  Family went over to see her today where she lives by herself and found her on the floor confused.  She had left-sided facial swelling for over the past week.  Neurologically she appears to be intact.  She is on Eliquis for A-fib.  Were not really sure what happened and how she ended up on the floor.  Really cannot get a great history from the patient.  She might be a little bit encephalopathic but she is able to answer yes/no questions.  There is no signs of obvious trauma on exam.  She does have fairly significant swelling to the left side of her face.  Not sure if this is the parotid or dental process but differential diagnosis is likely parotiditis or dental infection.  She has a history of colon cancer.  Overall we will pursue infectious workup as well as traumatic workup.  Will get head CT, neck CT, chest x-ray pelvic x-ray and CT soft tissue of the neck.  Will start IV antibiotics with IV Unasyn to cover for possible parotiditis/dental infection.  Will check labs including lactic acid and blood cultures and urinalysis.  EKG shows sinus tachycardia.  Will give IV fluid bolus and Tylenol.  Anticipate admission.  Clinically she looks very dehydrated.  Per my review and  interpretation of labs shows a leukocytosis of 15 but normal lactic acid.  She had a mild AKI with a creatinine of 1.6.  CT scan of the head and neck per radiology reports unremarkable.  She does appear to have a dental abscess 4 x 3 x 3 likely from the left lower molar.  There is extensive soft tissue swelling around the left  masseter muscle as well as the parotid gland.  Ultimately hospitalist here unable to except as we do not have oral surgery.  I talked with Dr. Ludwig Clarks with oral surgery at [facility] and Dr. Micael Hampshire with the emergency department who accepted the patient in transfer from ED to ED.  Images power shared over.  Hemodynamically stable throughout my care.  Added IV Flagyl to IV Unasyn already given.  Also give a dose IV Solu-Medrol per oral surgery recommendations.  This chart was dictated using voice recognition software.  Despite best efforts to proofread,  errors can occur which can change the documentation meaning.         Final Clinical Impression(s) / ED Diagnoses Final diagnoses:  Dental abscess  Facial cellulitis    Rx / DC Orders ED Discharge Orders     None         Virgina Norfolk, DO 05/08/22 2351

## 2022-05-08 NOTE — Assessment & Plan Note (Signed)
Continue statin. 

## 2022-05-08 NOTE — ED Notes (Signed)
Shift report received, assumed care of patient at this time.  

## 2022-05-08 NOTE — Assessment & Plan Note (Signed)
Would hold Eliquis as patient likely need surgical intervention

## 2022-05-08 NOTE — Assessment & Plan Note (Signed)
Given soft bp allow permissive HTN

## 2022-05-08 NOTE — ED Notes (Signed)
Abrasion noted on the L forearm.

## 2022-05-09 DIAGNOSIS — R220 Localized swelling, mass and lump, head: Secondary | ICD-10-CM | POA: Diagnosis present

## 2022-05-09 DIAGNOSIS — L03211 Cellulitis of face: Secondary | ICD-10-CM | POA: Diagnosis not present

## 2022-05-09 DIAGNOSIS — Z85038 Personal history of other malignant neoplasm of large intestine: Secondary | ICD-10-CM | POA: Diagnosis not present

## 2022-05-09 DIAGNOSIS — D72829 Elevated white blood cell count, unspecified: Secondary | ICD-10-CM | POA: Diagnosis not present

## 2022-05-09 DIAGNOSIS — K047 Periapical abscess without sinus: Secondary | ICD-10-CM | POA: Diagnosis not present

## 2022-05-09 DIAGNOSIS — Z7901 Long term (current) use of anticoagulants: Secondary | ICD-10-CM | POA: Diagnosis not present

## 2022-05-09 DIAGNOSIS — I4891 Unspecified atrial fibrillation: Secondary | ICD-10-CM | POA: Diagnosis not present

## 2022-05-09 DIAGNOSIS — W1830XA Fall on same level, unspecified, initial encounter: Secondary | ICD-10-CM | POA: Diagnosis not present

## 2022-05-09 LAB — OSMOLALITY: Osmolality: 299 mOsm/kg — ABNORMAL HIGH (ref 275–295)

## 2022-05-09 LAB — CBC WITH DIFFERENTIAL/PLATELET
Abs Immature Granulocytes: 0.06 10*3/uL (ref 0.00–0.07)
Basophils Absolute: 0.1 10*3/uL (ref 0.0–0.1)
Basophils Relative: 0 %
Eosinophils Absolute: 0 10*3/uL (ref 0.0–0.5)
Eosinophils Relative: 0 %
HCT: 28.4 % — ABNORMAL LOW (ref 36.0–46.0)
Hemoglobin: 9.2 g/dL — ABNORMAL LOW (ref 12.0–15.0)
Immature Granulocytes: 0 %
Lymphocytes Relative: 11 %
Lymphs Abs: 1.4 10*3/uL (ref 0.7–4.0)
MCH: 29.5 pg (ref 26.0–34.0)
MCHC: 32.4 g/dL (ref 30.0–36.0)
MCV: 91 fL (ref 80.0–100.0)
Monocytes Absolute: 1.1 10*3/uL — ABNORMAL HIGH (ref 0.1–1.0)
Monocytes Relative: 8 %
Neutro Abs: 10.9 10*3/uL — ABNORMAL HIGH (ref 1.7–7.7)
Neutrophils Relative %: 81 %
Platelets: 342 10*3/uL (ref 150–400)
RBC: 3.12 MIL/uL — ABNORMAL LOW (ref 3.87–5.11)
RDW: 12.8 % (ref 11.5–15.5)
WBC: 13.5 10*3/uL — ABNORMAL HIGH (ref 4.0–10.5)
nRBC: 0 % (ref 0.0–0.2)

## 2022-05-09 LAB — IRON AND TIBC
Iron: 12 ug/dL — ABNORMAL LOW (ref 28–170)
Saturation Ratios: 6 % — ABNORMAL LOW (ref 10.4–31.8)
TIBC: 193 ug/dL — ABNORMAL LOW (ref 250–450)
UIBC: 181 ug/dL

## 2022-05-09 LAB — URINE CULTURE: Culture: <10000 — AB

## 2022-05-09 LAB — TROPONIN I (HIGH SENSITIVITY): Troponin I (High Sensitivity): 47 ng/L — ABNORMAL HIGH (ref <18)

## 2022-05-09 LAB — TSH: TSH: 3.453 u[IU]/mL (ref 0.350–4.500)

## 2022-05-09 LAB — CULTURE, BLOOD (ROUTINE X 2): Culture: NO GROWTH

## 2022-05-09 LAB — FOLATE: Folate: 14.6 ng/mL (ref >5.9)

## 2022-05-09 LAB — PROCALCITONIN: Procalcitonin: 0.37 ng/mL

## 2022-05-09 LAB — RETICULOCYTES
Immature Retic Fract: 17.6 % — ABNORMAL HIGH (ref 2.3–15.9)
RBC.: 3.12 MIL/uL — ABNORMAL LOW (ref 3.87–5.11)
Retic Count, Absolute: 48.7 10*3/uL (ref 19.0–186.0)
Retic Ct Pct: 1.6 % (ref 0.4–3.1)

## 2022-05-09 LAB — MAGNESIUM: Magnesium: 1.7 mg/dL (ref 1.7–2.4)

## 2022-05-09 LAB — FERRITIN: Ferritin: 179 ng/mL (ref 11–307)

## 2022-05-09 LAB — VITAMIN B12: Vitamin B-12: 1243 pg/mL — ABNORMAL HIGH (ref 180–914)

## 2022-05-09 LAB — PHOSPHORUS: Phosphorus: 3.1 mg/dL (ref 2.5–4.6)

## 2022-05-09 NOTE — Progress Notes (Signed)
Pharmacy Antibiotic Note  Wendy Jenkins is a 89 y.o. female admitted on 05/08/2022 with  dental abscess .  Pharmacy has been consulted for Unasyn dosing.  Plan: Unasyn 3g IV Q12H.  Height: 4\' 9"  (144.8 cm) Weight: 72.2 kg (159 lb 2.8 oz) IBW/kg (Calculated) : 38.6  Temp (24hrs), Avg:99.1 F (37.3 C), Min:98.2 F (36.8 C), Max:99.5 F (37.5 C)  Recent Labs  Lab 05/08/22 1900 05/08/22 1915 05/08/22 2018 05/08/22 2110  WBC 15.3*  --   --   --   CREATININE 1.56*  --  1.50*  --   LATICACIDVEN  --  1.5  --  1.1    Estimated Creatinine Clearance: 21.7 mL/min (A) (by C-G formula based on SCr of 1.5 mg/dL (H)).    Allergies  Allergen Reactions   Oxybutynin Other (See Comments)    confusion   Sulfamethoxazole Rash    Thank you for allowing pharmacy to be a part of this patient's care.  Vernard Gambles, PharmD, BCPS  05/09/2022 12:08 AM

## 2022-05-09 NOTE — ED Notes (Addendum)
Patient report called to Dorann Ou RN at [facility]

## 2022-05-10 LAB — CULTURE, BLOOD (ROUTINE X 2): Special Requests: ADEQUATE

## 2022-05-12 LAB — CULTURE, BLOOD (ROUTINE X 2): Special Requests: ADEQUATE

## 2022-05-13 LAB — CULTURE, BLOOD (ROUTINE X 2): Culture: NO GROWTH
